# Patient Record
Sex: Male | Born: 1950 | Race: White | Hispanic: No | Marital: Married | Smoking: Former smoker
Health system: Southern US, Community
[De-identification: ages and names within clinical notes are randomized; demographics above are authoritative.]

## PROBLEM LIST (undated history)

## (undated) DIAGNOSIS — E785 Hyperlipidemia, unspecified: Secondary | ICD-10-CM

## (undated) DIAGNOSIS — G629 Polyneuropathy, unspecified: Secondary | ICD-10-CM

## (undated) DIAGNOSIS — Z8489 Family history of other specified conditions: Secondary | ICD-10-CM

## (undated) DIAGNOSIS — I2583 Coronary atherosclerosis due to lipid rich plaque: Secondary | ICD-10-CM

## (undated) DIAGNOSIS — N471 Phimosis: Secondary | ICD-10-CM

## (undated) DIAGNOSIS — N400 Enlarged prostate without lower urinary tract symptoms: Secondary | ICD-10-CM

## (undated) DIAGNOSIS — H269 Unspecified cataract: Secondary | ICD-10-CM

## (undated) DIAGNOSIS — H9192 Unspecified hearing loss, left ear: Secondary | ICD-10-CM

## (undated) DIAGNOSIS — G4733 Obstructive sleep apnea (adult) (pediatric): Secondary | ICD-10-CM

## (undated) DIAGNOSIS — H8109 Meniere's disease, unspecified ear: Secondary | ICD-10-CM

## (undated) DIAGNOSIS — H9319 Tinnitus, unspecified ear: Secondary | ICD-10-CM

## (undated) DIAGNOSIS — I2510 Atherosclerotic heart disease of native coronary artery without angina pectoris: Secondary | ICD-10-CM

## (undated) DIAGNOSIS — I10 Essential (primary) hypertension: Secondary | ICD-10-CM

## (undated) DIAGNOSIS — T7840XA Allergy, unspecified, initial encounter: Secondary | ICD-10-CM

## (undated) DIAGNOSIS — G5760 Lesion of plantar nerve, unspecified lower limb: Secondary | ICD-10-CM

## (undated) DIAGNOSIS — L989 Disorder of the skin and subcutaneous tissue, unspecified: Secondary | ICD-10-CM

## (undated) HISTORY — PX: CARDIAC CATHETERIZATION: SHX172

## (undated) HISTORY — DX: Allergy, unspecified, initial encounter: T78.40XA

## (undated) HISTORY — PX: MOHS SURGERY: SUR867

## (undated) HISTORY — DX: Tinnitus, unspecified ear: H93.19

## (undated) HISTORY — PX: UVULOPALATOPHARYNGOPLASTY: SHX827

## (undated) HISTORY — DX: Meniere's disease, unspecified ear: H81.09

## (undated) HISTORY — DX: Unspecified hearing loss, left ear: H91.92

## (undated) HISTORY — DX: Disorder of the skin and subcutaneous tissue, unspecified: L98.9

## (undated) HISTORY — DX: Hyperlipidemia, unspecified: E78.5

## (undated) HISTORY — DX: Unspecified cataract: H26.9

## (undated) HISTORY — DX: Essential (primary) hypertension: I10

## (undated) HISTORY — PX: CARDIOVASCULAR STRESS TEST: SHX262

## (undated) HISTORY — DX: Polyneuropathy, unspecified: G62.9

## (undated) HISTORY — PX: LASIK: SHX215

## (undated) HISTORY — PX: COLONOSCOPY: SHX174

## (undated) HISTORY — DX: Lesion of plantar nerve, unspecified lower limb: G57.60

---

## 1998-12-12 ENCOUNTER — Ambulatory Visit: Admission: RE | Admit: 1998-12-12 | Discharge: 1998-12-12 | Payer: Self-pay | Admitting: Otolaryngology

## 1999-01-27 ENCOUNTER — Encounter (INDEPENDENT_AMBULATORY_CARE_PROVIDER_SITE_OTHER): Payer: Self-pay | Admitting: Specialist

## 1999-01-27 ENCOUNTER — Other Ambulatory Visit: Admission: RE | Admit: 1999-01-27 | Discharge: 1999-01-27 | Payer: Self-pay | Admitting: Otolaryngology

## 1999-06-13 HISTORY — PX: FOOT SURGERY: SHX648

## 1999-12-21 ENCOUNTER — Other Ambulatory Visit: Admission: RE | Admit: 1999-12-21 | Discharge: 1999-12-21 | Payer: Self-pay | Admitting: Orthopedic Surgery

## 2000-03-13 ENCOUNTER — Encounter (INDEPENDENT_AMBULATORY_CARE_PROVIDER_SITE_OTHER): Payer: Self-pay | Admitting: Specialist

## 2000-03-13 ENCOUNTER — Other Ambulatory Visit: Admission: RE | Admit: 2000-03-13 | Discharge: 2000-03-13 | Payer: Self-pay | Admitting: Urology

## 2010-03-25 ENCOUNTER — Observation Stay (HOSPITAL_COMMUNITY): Admission: EM | Admit: 2010-03-25 | Discharge: 2010-03-26 | Payer: Self-pay | Admitting: Emergency Medicine

## 2010-03-25 ENCOUNTER — Encounter (INDEPENDENT_AMBULATORY_CARE_PROVIDER_SITE_OTHER): Payer: Self-pay | Admitting: Internal Medicine

## 2010-03-25 HISTORY — PX: TRANSTHORACIC ECHOCARDIOGRAM: SHX275

## 2010-08-24 LAB — BASIC METABOLIC PANEL
Calcium: 8.6 mg/dL (ref 8.4–10.5)
Creatinine, Ser: 0.84 mg/dL (ref 0.4–1.5)
GFR calc Af Amer: >60 mL/min (ref >60)
GFR calc non Af Amer: >60 mL/min (ref >60)
Sodium: 139 mEq/L (ref 135–145)

## 2010-08-25 LAB — RAPID URINE DRUG SCREEN, HOSP PERFORMED
Amphetamines: NOT DETECTED
Opiates: NOT DETECTED
Tetrahydrocannabinol: NOT DETECTED

## 2010-08-25 LAB — BASIC METABOLIC PANEL
CO2: 27 mEq/L (ref 19–32)
Calcium: 8.8 mg/dL (ref 8.4–10.5)
Calcium: 9 mg/dL (ref 8.4–10.5)
Creatinine, Ser: 0.93 mg/dL (ref 0.4–1.5)
GFR calc Af Amer: >60 mL/min (ref >60)
GFR calc Af Amer: >60 mL/min (ref >60)
GFR calc non Af Amer: >60 mL/min (ref >60)
GFR calc non Af Amer: >60 mL/min (ref >60)
Glucose, Bld: 110 mg/dL — ABNORMAL HIGH (ref 70–99)
Glucose, Bld: 119 mg/dL — ABNORMAL HIGH (ref 70–99)
Potassium: 2.8 mEq/L — ABNORMAL LOW (ref 3.5–5.1)
Sodium: 137 mEq/L (ref 135–145)
Sodium: 138 mEq/L (ref 135–145)

## 2010-08-25 LAB — CK TOTAL AND CKMB (NOT AT ARMC)
CK, MB: 1.1 ng/mL (ref 0.3–4.0)
CK, MB: 1.4 ng/mL (ref 0.3–4.0)
Relative Index: INVALID (ref 0.0–2.5)
Relative Index: INVALID (ref 0.0–2.5)
Total CK: 79 U/L (ref 7–232)
Total CK: 82 U/L (ref 7–232)
Total CK: 92 U/L (ref 7–232)

## 2010-08-25 LAB — POCT CARDIAC MARKERS
CKMB, poc: <1 ng/mL — ABNORMAL LOW (ref 1.0–8.0)
Myoglobin, poc: 38.2 ng/mL (ref 12–200)
Troponin i, poc: <0.05 ng/mL (ref 0.00–0.09)

## 2010-08-25 LAB — URINALYSIS, ROUTINE W REFLEX MICROSCOPIC
Bilirubin Urine: NEGATIVE
Nitrite: NEGATIVE
Protein, ur: NEGATIVE mg/dL
Specific Gravity, Urine: 1.006 (ref 1.005–1.030)
Urobilinogen, UA: 0.2 mg/dL (ref 0.0–1.0)

## 2010-08-25 LAB — DIFFERENTIAL
Lymphocytes Relative: 27 % (ref 12–46)
Lymphs Abs: 2.8 10*3/uL (ref 0.7–4.0)
Monocytes Absolute: 0.7 10*3/uL (ref 0.1–1.0)
Monocytes Relative: 7 % (ref 3–12)
Neutro Abs: 6.6 10*3/uL (ref 1.7–7.7)
Neutrophils Relative %: 64 % (ref 43–77)

## 2010-08-25 LAB — LIPID PANEL
Cholesterol: 125 mg/dL (ref 0–200)
HDL: 42 mg/dL (ref >39)
LDL Cholesterol: 65 mg/dL (ref 0–99)
Total CHOL/HDL Ratio: 3 RATIO

## 2010-08-25 LAB — CBC
HCT: 42.5 % (ref 39.0–52.0)
Hemoglobin: 15.1 g/dL (ref 13.0–17.0)
MCHC: 35.5 g/dL (ref 30.0–36.0)
RBC: 4.99 MIL/uL (ref 4.22–5.81)
WBC: 10.3 10*3/uL (ref 4.0–10.5)

## 2010-08-25 LAB — TSH: TSH: 0.639 u[IU]/mL (ref 0.350–4.500)

## 2010-08-25 LAB — HEMOGLOBIN A1C: Mean Plasma Glucose: 126 mg/dL — ABNORMAL HIGH (ref <117)

## 2010-09-13 ENCOUNTER — Other Ambulatory Visit (HOSPITAL_COMMUNITY): Payer: Self-pay | Admitting: Specialist

## 2010-09-13 ENCOUNTER — Other Ambulatory Visit: Payer: Self-pay | Admitting: Specialist

## 2010-09-13 ENCOUNTER — Other Ambulatory Visit (HOSPITAL_COMMUNITY): Payer: BC Managed Care – PPO

## 2010-09-13 ENCOUNTER — Ambulatory Visit (HOSPITAL_COMMUNITY)
Admission: RE | Admit: 2010-09-13 | Discharge: 2010-09-13 | Disposition: A | Discharge status: Home / Self Care | Payer: Medicare Other | Source: Ambulatory Visit | Attending: Specialist | Admitting: Specialist

## 2010-09-13 ENCOUNTER — Encounter (HOSPITAL_COMMUNITY): Payer: Medicare Other

## 2010-09-13 DIAGNOSIS — M5126 Other intervertebral disc displacement, lumbar region: Secondary | ICD-10-CM

## 2010-09-13 DIAGNOSIS — I10 Essential (primary) hypertension: Secondary | ICD-10-CM

## 2010-09-13 DIAGNOSIS — Z87891 Personal history of nicotine dependence: Secondary | ICD-10-CM

## 2010-09-13 DIAGNOSIS — Z01812 Encounter for preprocedural laboratory examination: Secondary | ICD-10-CM | POA: Insufficient documentation

## 2010-09-13 DIAGNOSIS — Z01810 Encounter for preprocedural cardiovascular examination: Secondary | ICD-10-CM | POA: Insufficient documentation

## 2010-09-13 DIAGNOSIS — Z01818 Encounter for other preprocedural examination: Secondary | ICD-10-CM | POA: Insufficient documentation

## 2010-09-13 LAB — URINALYSIS, ROUTINE W REFLEX MICROSCOPIC
Nitrite: NEGATIVE
Protein, ur: NEGATIVE mg/dL
Urobilinogen, UA: 0.2 mg/dL (ref 0.0–1.0)

## 2010-09-13 LAB — APTT: aPTT: 31 seconds (ref 24–37)

## 2010-09-13 LAB — COMPREHENSIVE METABOLIC PANEL
ALT: 36 U/L (ref 0–53)
AST: 27 U/L (ref 0–37)
Calcium: 9.6 mg/dL (ref 8.4–10.5)
GFR calc Af Amer: 60 mL/min — ABNORMAL LOW (ref >60)
Glucose, Bld: 121 mg/dL — ABNORMAL HIGH (ref 70–99)
Sodium: 137 mEq/L (ref 135–145)
Total Protein: 6.4 g/dL (ref 6.0–8.3)

## 2010-09-13 LAB — CBC
MCH: 28.7 pg (ref 26.0–34.0)
MCHC: 32.9 g/dL (ref 30.0–36.0)
Platelets: 218 10*3/uL (ref 150–400)
RBC: 5.15 MIL/uL (ref 4.22–5.81)

## 2010-09-13 LAB — PROTIME-INR: Prothrombin Time: 12.8 seconds (ref 11.6–15.2)

## 2010-09-13 LAB — SURGICAL PCR SCREEN: Staphylococcus aureus: NEGATIVE

## 2010-09-14 ENCOUNTER — Ambulatory Visit (HOSPITAL_COMMUNITY)
Admission: RE | Admit: 2010-09-14 | Discharge: 2010-09-14 | Disposition: A | Discharge status: Home / Self Care | Payer: Medicare Other | Source: Ambulatory Visit | Attending: Specialist | Admitting: Specialist

## 2010-09-14 ENCOUNTER — Other Ambulatory Visit: Payer: Self-pay | Admitting: Specialist

## 2010-09-14 ENCOUNTER — Observation Stay (HOSPITAL_COMMUNITY)
Admission: RE | Admit: 2010-09-14 | Discharge: 2010-09-15 | Disposition: A | Discharge status: Home / Self Care | Payer: Medicare Other | Source: Ambulatory Visit | Attending: Specialist | Admitting: Specialist

## 2010-09-14 ENCOUNTER — Other Ambulatory Visit (HOSPITAL_COMMUNITY): Payer: Self-pay | Admitting: Specialist

## 2010-09-14 DIAGNOSIS — M5126 Other intervertebral disc displacement, lumbar region: Principal | ICD-10-CM | POA: Insufficient documentation

## 2010-09-14 DIAGNOSIS — Z9889 Other specified postprocedural states: Secondary | ICD-10-CM

## 2010-09-14 DIAGNOSIS — Z01818 Encounter for other preprocedural examination: Secondary | ICD-10-CM | POA: Insufficient documentation

## 2010-09-14 DIAGNOSIS — G4733 Obstructive sleep apnea (adult) (pediatric): Secondary | ICD-10-CM | POA: Insufficient documentation

## 2010-09-14 DIAGNOSIS — E669 Obesity, unspecified: Secondary | ICD-10-CM | POA: Insufficient documentation

## 2010-09-14 DIAGNOSIS — Z01811 Encounter for preprocedural respiratory examination: Secondary | ICD-10-CM | POA: Insufficient documentation

## 2010-09-14 DIAGNOSIS — I10 Essential (primary) hypertension: Secondary | ICD-10-CM | POA: Insufficient documentation

## 2010-09-14 DIAGNOSIS — Z01812 Encounter for preprocedural laboratory examination: Secondary | ICD-10-CM | POA: Insufficient documentation

## 2010-09-14 HISTORY — PX: LUMBAR MICRODISCECTOMY: SHX99

## 2010-09-15 NOTE — Op Note (Signed)
NAME:  Charles Martinez, Charles Martinez                 ACCOUNT NO.:  [id number]  MEDICAL RECORD NO.:  [id number]           PATIENT TYPE:  O  LOCATION:  1611                         FACILITY:  [facility]  PHYSICIAN:  Jene Every, M.D.    DATE OF BIRTH:  1950-12-29  DATE OF PROCEDURE: DATE OF DISCHARGE:                              OPERATIVE REPORT   PREOPERATIVE DIAGNOSES:  Spinal stenosis, herniated nucleus pulposus L5- S1, right.  POSTOPERATIVE DIAGNOSES:  Spinal stenosis, herniated nucleus pulposus L5- S1, right.  PROCEDURES PERFORMED: 1. Lumbar decompression L5-S1, right. 2. Foraminotomies of L5 and S1. 3. Microdiskectomy, L5-S1.  ANESTHESIA:  General.  ASSISTANT:  Roma Schanz, P.A.  SPECIMEN:  L5-S1 disk to pathology.  HISTORY:  This is a 60 year old patient with stenosis, HNP affecting the L5-S1 nerve roots, confirmed by MRI by herniation and stenosis, indicated for decompression.  Risks and benefits discussed, including bleeding, infection, injury to the structures, no change in symptoms, worsening symptoms, need for repeat debridement, DVT, PE, anesthetic complications, etc.  TECHNIQUE:  The patient was placed in supine position.  After induction of adequate general anesthesia, 2 g of Kefzol was placed prone on Andrews frame.  All bony prominences were well padded.  Lumbar region prepped and draped in the usual sterile fashion.  Two 18-gauge spinal needles were utilized to localize L5-S1 interspace confirmed with x-ray. Incision was made from spinous process of L5-S1.  Subcutaneous tissues were dissected, electrocautery was utilized to achieve hemostasis. Dorsolumbar fascia identified and divided in line with the skin incision.  Paraspinous muscle elevated from lamina L5-L1.  Operating microscope draped and brought in the surgical field.  Confirmatory radiograph obtained with a Penfield 4 at the disk space of L5-L1. Hemilaminotomy of the caudad edge of 5 was performed with 2-mm  Kerrison. Straight curette to detached ligamentum flavum from cephalad edge of S1. Ligamentum flavum removed from the interspace.  It was hypertrophic. Facet hypertrophy was noted to decompress the lateral recess to the medial border of the pedicle, the partial medial facetectomy, foraminotomy of S1 was performed.  S1 nerve root was identified and gently mobilized medially.  It was noted to be compressing the lateral recess.  Foraminotomy of L5 was performed as well,  There was stenosis noted here.  Large HNP central to the right was noted.  I performed an annulotomy.  Copious portion of disk material was removed from the disk with straight upbiting pituitary further mobilizing the nerve hook hockey stick.  This was expressed with multiple fragments sent to pathology.  Disk space was copiously irrigated with antibiotic irrigation.  Confirmatory radiographs obtained.  Hockey stick probe passed freely up the foramen of L5 and S1.  Inspection revealed no CSF leakage or active bleeding, good restoration of thecal sac nerve root. Thrombin-soaked Gelfoam was placed in the laminotomy defect. Paraspinous muscles was inspected with no evidence of active bleeding. Dorsum and fascia reapproximated with #1 Vicryl figure-of-8 sutures. Skin was reapproximated with 4 subcuticular Prolene for Steri-Strips. Sterile surgical dressing applied.  Placed supine on hospital bed, extubated without difficulty and transported to the recovery room in satisfactory condition.  The  patient tolerated the procedure well.  There were no complications.  ESTIMATED BLOOD LOSS:  Minimal.     Jene Every, M.D.     JB/MEDQ  D:  09/14/2010  T:  09/15/2010  Job:  [id number]  Electronically Signed by Jene Every M.D. on 09/15/2010 12:54:44 PM

## 2010-10-05 ENCOUNTER — Ambulatory Visit: Payer: Medicare Other | Attending: Specialist | Admitting: Physical Therapy

## 2010-10-05 DIAGNOSIS — M545 Low back pain, unspecified: Secondary | ICD-10-CM | POA: Insufficient documentation

## 2010-10-05 DIAGNOSIS — IMO0001 Reserved for inherently not codable concepts without codable children: Secondary | ICD-10-CM | POA: Insufficient documentation

## 2010-10-05 DIAGNOSIS — R5381 Other malaise: Secondary | ICD-10-CM | POA: Insufficient documentation

## 2010-10-07 ENCOUNTER — Ambulatory Visit: Payer: Medicare Other | Admitting: Physical Therapy

## 2010-10-11 ENCOUNTER — Ambulatory Visit: Payer: Medicare Other | Attending: Specialist | Admitting: Physical Therapy

## 2010-10-11 DIAGNOSIS — IMO0001 Reserved for inherently not codable concepts without codable children: Secondary | ICD-10-CM | POA: Insufficient documentation

## 2010-10-11 DIAGNOSIS — R5381 Other malaise: Secondary | ICD-10-CM | POA: Insufficient documentation

## 2010-10-11 DIAGNOSIS — M545 Low back pain, unspecified: Secondary | ICD-10-CM | POA: Insufficient documentation

## 2010-10-13 ENCOUNTER — Ambulatory Visit: Payer: Medicare Other | Admitting: Physical Therapy

## 2010-10-18 ENCOUNTER — Ambulatory Visit: Payer: Medicare Other | Admitting: Physical Therapy

## 2010-10-20 ENCOUNTER — Ambulatory Visit: Payer: Medicare Other | Admitting: Physical Therapy

## 2010-10-25 ENCOUNTER — Ambulatory Visit: Payer: Medicare Other | Admitting: *Deleted

## 2010-10-28 ENCOUNTER — Ambulatory Visit: Payer: Medicare Other | Admitting: *Deleted

## 2010-11-01 ENCOUNTER — Ambulatory Visit: Payer: Medicare Other | Admitting: *Deleted

## 2010-11-04 ENCOUNTER — Ambulatory Visit: Payer: Medicare Other | Admitting: Physical Therapy

## 2010-11-09 ENCOUNTER — Ambulatory Visit: Payer: Medicare Other | Admitting: Physical Therapy

## 2010-11-16 ENCOUNTER — Ambulatory Visit: Payer: Medicare Other | Attending: Specialist | Admitting: Physical Therapy

## 2010-11-16 DIAGNOSIS — M545 Low back pain, unspecified: Secondary | ICD-10-CM | POA: Insufficient documentation

## 2010-11-16 DIAGNOSIS — IMO0001 Reserved for inherently not codable concepts without codable children: Secondary | ICD-10-CM | POA: Insufficient documentation

## 2010-11-16 DIAGNOSIS — R5381 Other malaise: Secondary | ICD-10-CM | POA: Insufficient documentation

## 2011-02-09 ENCOUNTER — Other Ambulatory Visit: Payer: Self-pay | Admitting: Ophthalmology

## 2011-10-09 ENCOUNTER — Other Ambulatory Visit: Payer: Self-pay | Admitting: Dermatology

## 2011-10-24 IMAGING — CR DG SPINE 1V PORT
1 series · 1 of 1 positions shown · non-contrast
Comparison: Multiple priors.

CLINICAL DATA: Back pain

LUMBAR SPINE 1 VIEW

[lateral]
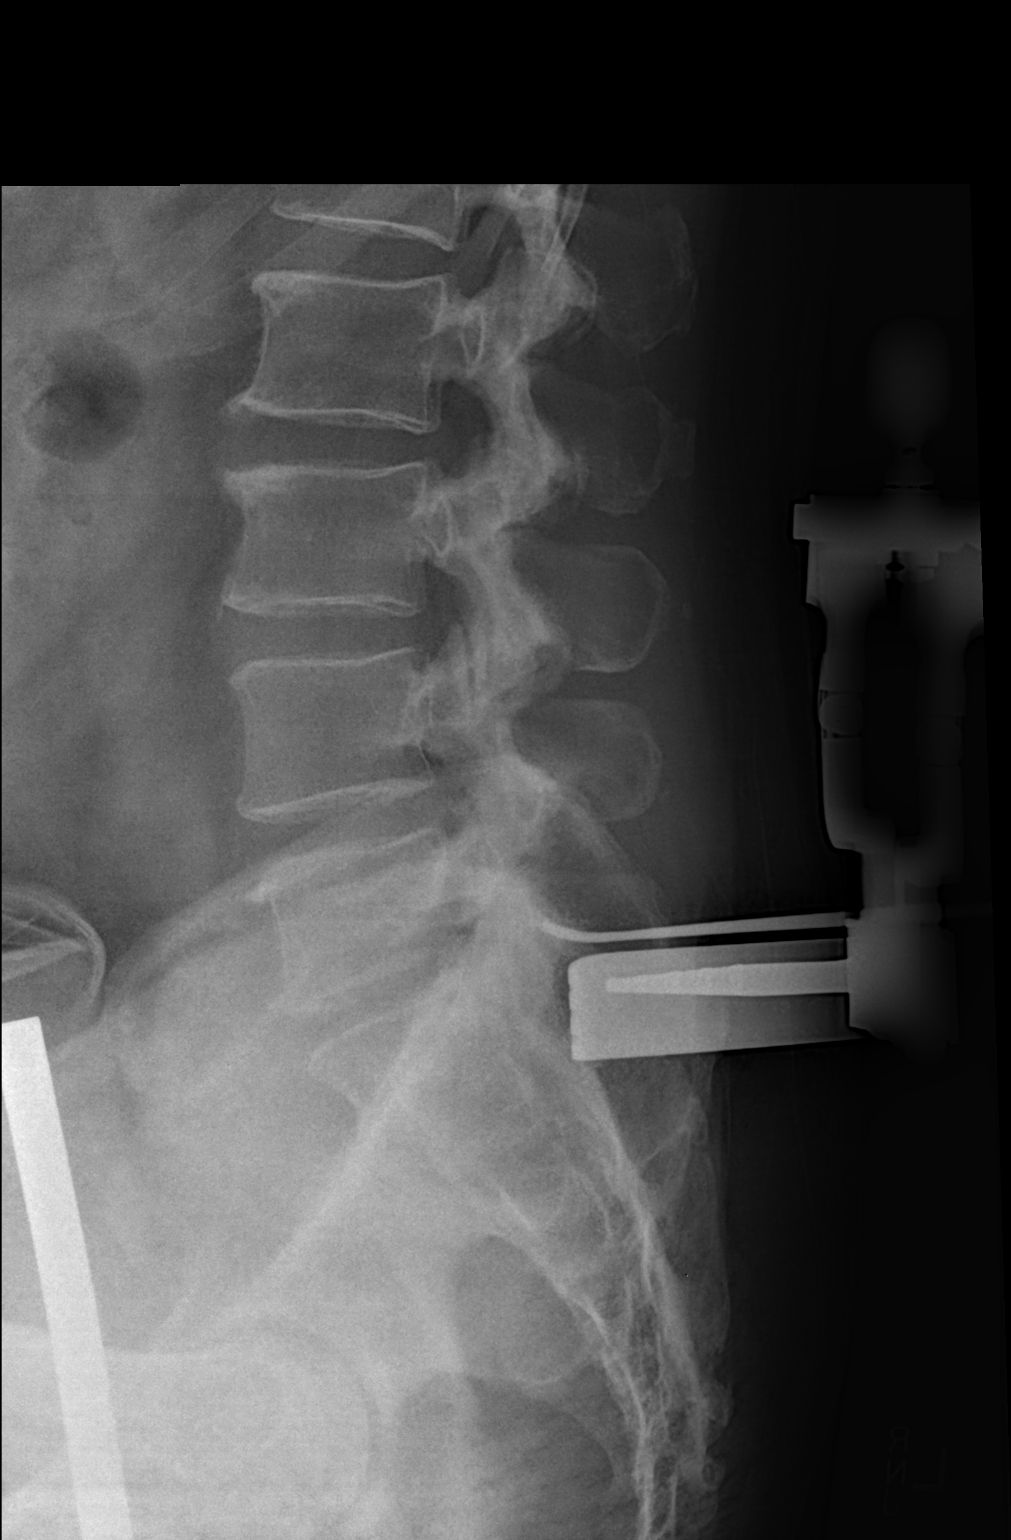

[1 of 1 positions shown; findings below may reference images not displayed]

FINDINGS: Blunt probe from a posterior approach directed most
closely toward L5-S1.
IMPRESSION: As above.

## 2013-04-02 ENCOUNTER — Other Ambulatory Visit: Payer: Self-pay

## 2013-04-02 ENCOUNTER — Other Ambulatory Visit: Payer: Self-pay | Admitting: Family Medicine

## 2013-04-02 DIAGNOSIS — N62 Hypertrophy of breast: Secondary | ICD-10-CM

## 2013-04-15 ENCOUNTER — Ambulatory Visit
Admission: RE | Admit: 2013-04-15 | Discharge: 2013-04-15 | Disposition: A | Discharge status: Home / Self Care | Payer: Medicare Other | Source: Ambulatory Visit | Attending: Family Medicine | Admitting: Family Medicine

## 2013-04-15 DIAGNOSIS — N62 Hypertrophy of breast: Secondary | ICD-10-CM

## 2013-04-24 ENCOUNTER — Encounter: Payer: Self-pay | Admitting: Internal Medicine

## 2013-04-24 ENCOUNTER — Ambulatory Visit (INDEPENDENT_AMBULATORY_CARE_PROVIDER_SITE_OTHER): Payer: Medicare Other | Admitting: Internal Medicine

## 2013-04-24 VITALS — BP 128/68 | HR 78 | Ht 70.0 in | Wt 210.5 lb

## 2013-04-24 DIAGNOSIS — I2510 Atherosclerotic heart disease of native coronary artery without angina pectoris: Secondary | ICD-10-CM

## 2013-04-24 DIAGNOSIS — E785 Hyperlipidemia, unspecified: Secondary | ICD-10-CM

## 2013-04-24 DIAGNOSIS — R600 Localized edema: Secondary | ICD-10-CM

## 2013-04-24 DIAGNOSIS — R609 Edema, unspecified: Secondary | ICD-10-CM

## 2013-04-24 DIAGNOSIS — I10 Essential (primary) hypertension: Secondary | ICD-10-CM

## 2013-04-24 NOTE — Patient Instructions (Signed)
Your physician wants you to follow-up in: 1 year. You will receive a reminder letter in the mail two months in advance. If you don't receive a letter, please call our office to schedule the follow-up appointment.  

## 2013-04-25 ENCOUNTER — Encounter: Payer: Self-pay | Admitting: Internal Medicine

## 2013-04-25 DIAGNOSIS — R600 Localized edema: Secondary | ICD-10-CM | POA: Insufficient documentation

## 2013-04-25 DIAGNOSIS — E785 Hyperlipidemia, unspecified: Secondary | ICD-10-CM | POA: Insufficient documentation

## 2013-04-25 DIAGNOSIS — I10 Essential (primary) hypertension: Secondary | ICD-10-CM | POA: Insufficient documentation

## 2013-04-25 DIAGNOSIS — I2510 Atherosclerotic heart disease of native coronary artery without angina pectoris: Secondary | ICD-10-CM | POA: Insufficient documentation

## 2013-04-25 NOTE — Progress Notes (Signed)
OFFICE NOTE  Chief Complaint:  Routine follow-up  Primary Care Physician: Warrick Parisian, MD  HPI:  Charles Martinez is a 62 year old gentleman with a history of dyslipidemia, hypokalemia, hypertension, and possibly either occluded distal LAD or congenitally occluded LAD distally. He has had problems with sciatic pain and recently had back surgery with marked improvement. He has also had dyslipidemia but has been intolerant to statins and is only fenofibrate at this time. After that episode in 2011 with hypertension and hypokalemia he was on a number of blood pressure medicines. However, recently he has been feeling fatigued and dizzy. He checked his blood pressure and it was in the 80s systolic and he has since then cut back on his medicines. He has discontinued labetalol altogether and previously was on 200 b.i.d. He recently stopped his hydralazine due to low blood pressure and is only on ramipril 10 mg and aspirin 81 mg daily. He also takes Aldactone 50 mg daily. He has no complaints as far as chest pain, worsening shortness of breath, palpitations, presyncope, or syncopal symptoms.    PMHx:  Past Medical History  Diagnosis Date  . Dyslipidemia     intolerant to statins  . Hypertension   . Hypokalemia     History reviewed. No pertinent past surgical history.  FAMHx:  No family history on file.  SOCHx:   reports that he quit smoking about 18 years ago. He has never used smokeless tobacco. He reports that he does not drink alcohol or use illicit drugs.  ALLERGIES:  Allergies  Allergen Reactions  . Bystolic [Nebivolol Hcl]     SOB  . Hctz [Hydrochlorothiazide]   . Hydrazine Yellow [Tartrazine]     SOB   . Lipitor [Atorvastatin]     myalgias    ROS: A comprehensive review of systems was negative.  HOME MEDS: Current Outpatient Prescriptions  Medication Sig Dispense Refill  . ACETYLCARN-ALPHA LIPOIC ACID PO Take by mouth daily.      . Ascorbic Acid (VITAMIN C PO)  Take by mouth daily.      Marland Kitchen aspirin 81 MG tablet Take 81 mg by mouth daily.      Marland Kitchen b complex vitamins capsule Take 1 capsule by mouth daily.      . Biotin 5000 MCG TABS Take 1 tablet by mouth daily.      . Calcium Citrate-Vitamin D (CALCIUM CITRATE + D3 PO) Take by mouth daily.      . Cholecalciferol (VITAMIN D-3) 1000 UNITS CAPS Take 2,000 Units by mouth daily.      . clonazePAM (KLONOPIN) 0.5 MG tablet Take 0.5 mg by mouth 2 (two) times daily as needed for anxiety.      . Coenzyme Q10 (CO Q-10) 100 MG CAPS Take 1 capsule by mouth daily.      . finasteride (PROSCAR) 5 MG tablet Take 5 mg by mouth daily.      Marland Kitchen gabapentin (NEURONTIN) 300 MG capsule Take 300 mg by mouth as needed.      . magnesium oxide (MAG-OX) 400 MG tablet Take 400 mg by mouth daily.      Marland Kitchen NIACIN PO Take by mouth daily.      . potassium chloride SA (K-DUR,KLOR-CON) 20 MEQ tablet Take 20 mEq by mouth daily.      . ramipril (ALTACE) 10 MG capsule Take 10 mg by mouth daily.      . Red Yeast Rice 600 MG CAPS Take 1,200 mg by mouth 2 (two) times  daily.      . spironolactone (ALDACTONE) 50 MG tablet Take 50 mg by mouth daily.      . vitamin B-12 (CYANOCOBALAMIN) 1000 MCG tablet Take 1,000 mcg by mouth daily.       No current facility-administered medications for this visit.    LABS/IMAGING: No results found for this or any previous visit (from the past 48 hour(s)). No results found.  VITALS: BP 128/68  Pulse 78  Ht 5\' 10"  (1.778 m)  Wt 210 lb 8 oz (95.482 kg)  BMI 30.20 kg/m2  EXAM: General appearance: alert and no distress Neck: no carotid bruit and no JVD Lungs: clear to auscultation bilaterally Heart: regular rate and rhythm, S1, S2 normal, no murmur, click, rub or gallop Abdomen: soft, non-tender; bowel sounds normal; no masses,  no organomegaly Extremities: extremities normal, atraumatic, no cyanosis or edema Pulses: 2+ and symmetric Skin: Skin color, texture, turgor normal. No rashes or lesions Neurologic:  Grossly normal Psych: Mood, affect normal  EKG: Rhythm with PACs, nonspecific ST changes at 78  ASSESSMENT: 1. Hypertension-well controlled 2. Lower extremity edema 3. Mild anxiety 4. Dyslipidemia-intolerant to statins 5. 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   For some reason [name] blood pressure is getting lower. This may be due to retirement and less stress. Some of it may be situational. He is come off of hydralazine completely. Currently his blood pressures are well controlled. I've encouraged him to keep working on exercise and weight loss to manage his dyslipidemia as he has been intolerant to almost all statins. To see him back annually.  Chrystie Nose, MD, [facility] Attending Cardiologist [facility]  Vietta Bonifield C 04/25/2013, 11:54 AM'

## 2014-04-24 ENCOUNTER — Encounter: Payer: Self-pay | Admitting: Internal Medicine

## 2014-04-24 ENCOUNTER — Ambulatory Visit (INDEPENDENT_AMBULATORY_CARE_PROVIDER_SITE_OTHER): Payer: Medicare Other | Admitting: Internal Medicine

## 2014-04-24 VITALS — BP 120/80 | HR 74 | Ht 70.0 in | Wt 207.2 lb

## 2014-04-24 DIAGNOSIS — I2583 Coronary atherosclerosis due to lipid rich plaque: Secondary | ICD-10-CM

## 2014-04-24 DIAGNOSIS — E785 Hyperlipidemia, unspecified: Secondary | ICD-10-CM

## 2014-04-24 DIAGNOSIS — I2510 Atherosclerotic heart disease of native coronary artery without angina pectoris: Secondary | ICD-10-CM

## 2014-04-24 DIAGNOSIS — I10 Essential (primary) hypertension: Secondary | ICD-10-CM

## 2014-04-24 NOTE — Progress Notes (Signed)
OFFICE NOTE  Chief Complaint:  Routine follow-up  [facility]  HPI:  Kathee DeltonRoger L Martinez is a 63 year old gentleman with a history of dyslipidemia, hypokalemia, hypertension, and possibly either occluded distal LAD or congenitally occluded LAD distally. He has had problems with sciatic pain and recently had back surgery with marked improvement. He has also had dyslipidemia but has been intolerant to statins and is only fenofibrate at this time. After that episode in 2011 with hypertension and hypokalemia he was on a number of blood pressure medicines. However, recently he has been feeling fatigued and dizzy. He checked his blood pressure and it was in the 80s systolic and he has since then cut back on his medicines. He has discontinued labetalol altogether and previously was on 200 b.i.d. He recently stopped his hydralazine due to low blood pressure and is only on ramipril 10 mg and aspirin 81 mg daily. He also takes Aldactone 50 mg daily. He has no complaints as far as chest pain, worsening shortness of breath, palpitations, presyncope, or syncopal symptoms.   [name] returns today for follow-up. He denies any chest pain or worsening shortness of breath. He has had a couple episodes where he felt that he was not in often quickly woke up. This does not happen while driving but he did have an episode about 2 years ago. We have been decreasing his blood pressure medications although seems to be stable now only on ramipril and Aldactone.  PMHx:  Past Medical History  Diagnosis Date  . Dyslipidemia     intolerant to statins  . Hypertension   . Hypokalemia   . Skin disorder   . High cholesterol   . Sleep apnea, obstructive   . Meniere's disease   . Tinnitus   . Hearing loss in left ear   . Morton's neuroma     Past Surgical History  Procedure Laterality Date  . Back surgery    . Foot surgery  2001  . Nose and throat surgery  2000    . Doppler echocardiography    . Lexiscan myoview      FAMHx:  History reviewed. No pertinent family history.  SOCHx:   reports that he quit smoking about 19 years ago. He has never used smokeless tobacco. He reports that he does not drink alcohol or use illicit drugs.  ALLERGIES:  Allergies  Allergen Reactions  . Bystolic [Nebivolol Hcl]     SOB  . Hctz [Hydrochlorothiazide]   . Hydrazine Yellow [Tartrazine]     SOB   . Lipitor [Atorvastatin]     myalgias    ROS: A comprehensive review of systems was negative.  HOME MEDS: Current Outpatient Prescriptions  Medication Sig Dispense Refill  . ACETYLCARN-ALPHA LIPOIC ACID PO Take by mouth daily.    . Ascorbic Acid (VITAMIN C PO) Take by mouth daily.    Marland Kitchen. aspirin 81 MG tablet Take 81 mg by mouth daily.    Marland Kitchen. b complex vitamins capsule Take 1 capsule by mouth daily.    . Biotin 5000 MCG TABS Take 1 tablet by mouth daily.    . Calcium Citrate-Vitamin D (CALCIUM CITRATE + D3 PO) Take by mouth daily.    . Cholecalciferol (VITAMIN D-3) 1000 UNITS CAPS Take 2,000 Units by mouth daily.    . clonazePAM (KLONOPIN) 0.5 MG tablet Take 0.5 mg by mouth 2 (two) times daily as needed for anxiety.    . Coenzyme Q10 (CO Q-10)  100 MG CAPS Take 1 capsule by mouth daily.    . finasteride (PROSCAR) 5 MG tablet Take 5 mg by mouth daily.    Marland Kitchen. gabapentin (NEURONTIN) 300 MG capsule Take 300 mg by mouth as needed.    . magnesium oxide (MAG-OX) 400 MG tablet Take 400 mg by mouth daily.    Marland Kitchen. NIACIN PO Take by mouth daily.    . potassium chloride SA (K-DUR,KLOR-CON) 20 MEQ tablet Take 20 mEq by mouth daily.    . ramipril (ALTACE) 10 MG capsule Take 10 mg by mouth daily.    . Red Yeast Rice 600 MG CAPS Take 1,200 mg by mouth 2 (two) times daily.    Marland Kitchen. spironolactone (ALDACTONE) 50 MG tablet Take 50 mg by mouth daily.    . vitamin B-12 (CYANOCOBALAMIN) 1000 MCG tablet Take 1,000 mcg by mouth daily.     No current facility-administered medications for this  visit.    LABS/IMAGING: No results found for this or any previous visit (from the past 48 hour(s)). No results found.  VITALS: BP 120/80 mmHg  Pulse 74  Ht 5\' 10"  (1.778 m)  Wt 207 lb 3.2 oz (93.985 kg)  BMI 29.73 kg/m2  EXAM: General appearance: alert and no distress Neck: no carotid bruit and no JVD Lungs: clear to auscultation bilaterally Heart: regular rate and rhythm, S1, S2 normal, no murmur, click, rub or gallop Abdomen: soft, non-tender; bowel sounds normal; no masses,  no organomegaly Extremities: extremities normal, atraumatic, no cyanosis or edema Pulses: 2+ and symmetric Skin: Skin color, texture, turgor normal. No rashes or lesions Neurologic: Grossly normal Psych: Mood, affect normal  EKG: Normal sinus rhythm at 74  ASSESSMENT: 1. Hypertension-well controlled 2. Lower extremity edema 3. Mild anxiety 4. Dyslipidemia-intolerant to statins 5. 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   [name] blood pressure appears to be stable on his current medicines. He occasionally gets some leg edema however is not significant today. He has been intolerant to statins in the past and his cholesterol is followed by his primary care provider. 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 Is not any active coronary disease since then I'm aware of. He also has a history of sleep apnea but underwent UVPP surgeryand reports he sleeps well, does not snore and has good energy levels. I would recommend continue his current medications and plan to see him back annually or sooner as necessary.  Chrystie NoseKenneth C. Bobbie Valletta, MD, [facility] Attending Cardiologist [facility]  Rozalyn Osland C 04/24/2014, 10:49 AM'

## 2014-04-24 NOTE — Patient Instructions (Signed)
Your physician wants you to follow-up in: 1 year with Dr. Hilty. You will receive a reminder letter in the mail two months in advance. If you don't receive a letter, please call our office to schedule the follow-up appointment.  

## 2015-02-02 ENCOUNTER — Other Ambulatory Visit: Payer: Self-pay | Admitting: Urology

## 2015-03-16 ENCOUNTER — Encounter (HOSPITAL_BASED_OUTPATIENT_CLINIC_OR_DEPARTMENT_OTHER): Payer: Self-pay | Admitting: *Deleted

## 2015-03-18 ENCOUNTER — Encounter (HOSPITAL_BASED_OUTPATIENT_CLINIC_OR_DEPARTMENT_OTHER): Payer: Self-pay | Admitting: *Deleted

## 2015-03-18 NOTE — Progress Notes (Signed)
Pt instructed npo pmn 10/09 x klonopin and potassium w sip of water.  To [facility] 10/10 @ 0830.  Needs istat on arrival.  ekg w chart

## 2015-03-22 ENCOUNTER — Ambulatory Visit (HOSPITAL_BASED_OUTPATIENT_CLINIC_OR_DEPARTMENT_OTHER): Payer: Medicare Other | Admitting: Anesthesiology

## 2015-03-22 ENCOUNTER — Ambulatory Visit (HOSPITAL_BASED_OUTPATIENT_CLINIC_OR_DEPARTMENT_OTHER)
Admission: RE | Admit: 2015-03-22 | Discharge: 2015-03-22 | Disposition: A | Discharge status: Home / Self Care | Payer: Medicare Other | Source: Ambulatory Visit | Attending: Urology | Admitting: Urology

## 2015-03-22 ENCOUNTER — Encounter (HOSPITAL_BASED_OUTPATIENT_CLINIC_OR_DEPARTMENT_OTHER): Payer: Self-pay | Admitting: *Deleted

## 2015-03-22 ENCOUNTER — Encounter (HOSPITAL_BASED_OUTPATIENT_CLINIC_OR_DEPARTMENT_OTHER)
Admission: RE | Disposition: A | Discharge status: Home / Self Care | Payer: Self-pay | Source: Ambulatory Visit | Attending: Urology

## 2015-03-22 DIAGNOSIS — Z7982 Long term (current) use of aspirin: Secondary | ICD-10-CM | POA: Insufficient documentation

## 2015-03-22 DIAGNOSIS — G4733 Obstructive sleep apnea (adult) (pediatric): Secondary | ICD-10-CM | POA: Insufficient documentation

## 2015-03-22 DIAGNOSIS — N486 Induration penis plastica: Secondary | ICD-10-CM | POA: Diagnosis not present

## 2015-03-22 DIAGNOSIS — E785 Hyperlipidemia, unspecified: Secondary | ICD-10-CM | POA: Insufficient documentation

## 2015-03-22 DIAGNOSIS — N529 Male erectile dysfunction, unspecified: Secondary | ICD-10-CM | POA: Insufficient documentation

## 2015-03-22 DIAGNOSIS — Z79899 Other long term (current) drug therapy: Secondary | ICD-10-CM | POA: Insufficient documentation

## 2015-03-22 DIAGNOSIS — I10 Essential (primary) hypertension: Secondary | ICD-10-CM | POA: Diagnosis not present

## 2015-03-22 DIAGNOSIS — N471 Phimosis: Secondary | ICD-10-CM

## 2015-03-22 DIAGNOSIS — I2583 Coronary atherosclerosis due to lipid rich plaque: Secondary | ICD-10-CM | POA: Insufficient documentation

## 2015-03-22 DIAGNOSIS — I2510 Atherosclerotic heart disease of native coronary artery without angina pectoris: Secondary | ICD-10-CM | POA: Diagnosis not present

## 2015-03-22 DIAGNOSIS — N480 Leukoplakia of penis: Secondary | ICD-10-CM | POA: Insufficient documentation

## 2015-03-22 DIAGNOSIS — G4730 Sleep apnea, unspecified: Secondary | ICD-10-CM | POA: Diagnosis not present

## 2015-03-22 DIAGNOSIS — Z87891 Personal history of nicotine dependence: Secondary | ICD-10-CM | POA: Insufficient documentation

## 2015-03-22 DIAGNOSIS — N400 Enlarged prostate without lower urinary tract symptoms: Secondary | ICD-10-CM | POA: Insufficient documentation

## 2015-03-22 DIAGNOSIS — H8109 Meniere's disease, unspecified ear: Secondary | ICD-10-CM | POA: Diagnosis not present

## 2015-03-22 HISTORY — DX: Benign prostatic hyperplasia without lower urinary tract symptoms: N40.0

## 2015-03-22 HISTORY — DX: Coronary atherosclerosis due to lipid rich plaque: I25.83

## 2015-03-22 HISTORY — DX: Obstructive sleep apnea (adult) (pediatric): G47.33

## 2015-03-22 HISTORY — DX: Phimosis: N47.1

## 2015-03-22 HISTORY — DX: Atherosclerotic heart disease of native coronary artery without angina pectoris: I25.10

## 2015-03-22 HISTORY — PX: CIRCUMCISION: SHX1350

## 2015-03-22 HISTORY — DX: Family history of other specified conditions: Z84.89

## 2015-03-22 LAB — POCT I-STAT 4, (NA,K, GLUC, HGB,HCT)
GLUCOSE: 102 mg/dL — AB (ref 65–99)
HCT: 49 % (ref 39.0–52.0)
HEMOGLOBIN: 16.7 g/dL (ref 13.0–17.0)
POTASSIUM: 4.2 mmol/L (ref 3.5–5.1)
SODIUM: 138 mmol/L (ref 135–145)

## 2015-03-22 SURGERY — CIRCUMCISION, ADULT
Anesthesia: Monitor Anesthesia Care

## 2015-03-22 MED ORDER — MIDAZOLAM HCL 5 MG/5ML IJ SOLN
INTRAMUSCULAR | Status: DC | PRN
  Administered 2015-03-22: 2 mg via INTRAVENOUS

## 2015-03-22 MED ORDER — CEFAZOLIN SODIUM-DEXTROSE 2-3 GM-% IV SOLR
INTRAVENOUS | Status: AC
  Filled 2015-03-22: qty 50

## 2015-03-22 MED ORDER — FENTANYL CITRATE (PF) 100 MCG/2ML IJ SOLN
25.0000 ug | INTRAMUSCULAR | Status: DC | PRN
  Filled 2015-03-22: qty 1

## 2015-03-22 MED ORDER — ONDANSETRON HCL 4 MG/2ML IJ SOLN
INTRAMUSCULAR | Status: DC | PRN
  Administered 2015-03-22: 4 mg via INTRAVENOUS

## 2015-03-22 MED ORDER — SODIUM CHLORIDE 0.9 % IR SOLN
Status: DC | PRN
  Administered 2015-03-22: 500 mL

## 2015-03-22 MED ORDER — LACTATED RINGERS IV SOLN
INTRAVENOUS | Status: DC
  Administered 2015-03-22: 10:00:00 via INTRAVENOUS
  Filled 2015-03-22: qty 1000

## 2015-03-22 MED ORDER — PROPOFOL 500 MG/50ML IV EMUL
INTRAVENOUS | Status: DC | PRN
  Administered 2015-03-22: 200 ug/kg/min via INTRAVENOUS

## 2015-03-22 MED ORDER — CEFAZOLIN SODIUM 1-5 GM-% IV SOLN
1.0000 g | INTRAVENOUS | Status: DC
  Filled 2015-03-22: qty 50

## 2015-03-22 MED ORDER — ONDANSETRON HCL 4 MG/2ML IJ SOLN
4.0000 mg | Freq: Once | INTRAMUSCULAR | Status: DC | PRN
  Filled 2015-03-22: qty 2

## 2015-03-22 MED ORDER — MIDAZOLAM HCL 2 MG/2ML IJ SOLN
INTRAMUSCULAR | Status: AC
  Filled 2015-03-22: qty 2

## 2015-03-22 MED ORDER — CEFAZOLIN SODIUM-DEXTROSE 2-3 GM-% IV SOLR
2.0000 g | INTRAVENOUS | Status: AC
  Administered 2015-03-22: 2 g via INTRAVENOUS
  Filled 2015-03-22: qty 50

## 2015-03-22 MED ORDER — BUPIVACAINE HCL 0.25 % IJ SOLN
INTRAMUSCULAR | Status: DC | PRN
  Administered 2015-03-22: 23 mL

## 2015-03-22 MED ORDER — LIDOCAINE HCL (CARDIAC) 20 MG/ML IV SOLN
INTRAVENOUS | Status: DC | PRN
  Administered 2015-03-22: 50 mg via INTRAVENOUS

## 2015-03-22 MED ORDER — HYDROCODONE-ACETAMINOPHEN 5-325 MG PO TABS: 1.0000 | ORAL_TABLET | ORAL | Status: DC | PRN

## 2015-03-22 MED ORDER — FENTANYL CITRATE (PF) 100 MCG/2ML IJ SOLN
INTRAMUSCULAR | Status: AC
  Filled 2015-03-22: qty 2

## 2015-03-22 SURGICAL SUPPLY — 25 items
BANDAGE CO FLEX L/F 1IN X 5YD (GAUZE/BANDAGES/DRESSINGS) ×3 IMPLANT
BLADE SURG 15 STRL LF DISP TIS (BLADE) ×1 IMPLANT
BLADE SURG 15 STRL SS (BLADE) ×3
BNDG COHESIVE 1X5 TAN STRL LF (GAUZE/BANDAGES/DRESSINGS) ×3 IMPLANT
BNDG CONFORM 2 STRL LF (GAUZE/BANDAGES/DRESSINGS) ×3 IMPLANT
CLOTH BEACON ORANGE TIMEOUT ST (SAFETY) ×3 IMPLANT
COVER BACK TABLE 60X90IN (DRAPES) ×3 IMPLANT
COVER MAYO STAND STRL (DRAPES) ×3 IMPLANT
DRAPE PED LAPAROTOMY (DRAPES) ×3 IMPLANT
ELECT REM PT RETURN 9FT ADLT (ELECTROSURGICAL) ×3
ELECTRODE REM PT RTRN 9FT ADLT (ELECTROSURGICAL) ×1 IMPLANT
GAUZE VASELINE 1X8 (GAUZE/BANDAGES/DRESSINGS) ×3 IMPLANT
GLOVE BIO SURGEON STRL SZ8 (GLOVE) ×3 IMPLANT
GOWN STRL REUS W/ TWL XL LVL3 (GOWN DISPOSABLE) ×1 IMPLANT
GOWN STRL REUS W/TWL XL LVL3 (GOWN DISPOSABLE) ×3
GOWN W/COTTON TOWEL STD LRG (GOWNS) ×3 IMPLANT
GOWN XL W/COTTON TOWEL STD (GOWNS) ×3 IMPLANT
NEEDLE HYPO 22GX1.5 SAFETY (NEEDLE) ×3 IMPLANT
NS IRRIG 500ML POUR BTL (IV SOLUTION) ×3 IMPLANT
PACK BASIN DAY SURGERY FS (CUSTOM PROCEDURE TRAY) ×3 IMPLANT
PENCIL BUTTON HOLSTER BLD 10FT (ELECTRODE) ×3 IMPLANT
SUT CHROMIC 4 0 RB 1X27 (SUTURE) ×6 IMPLANT
SYRINGE CONTROL L 12CC (SYRINGE) ×3 IMPLANT
TOWEL OR 17X24 6PK STRL BLUE (TOWEL DISPOSABLE) ×6 IMPLANT
WATER STERILE IRR 500ML POUR (IV SOLUTION) ×3 IMPLANT

## 2015-03-22 NOTE — Discharge Instructions (Signed)
Postoperative instructions for circumcision ° °Wound: ° °Remove the dressing the morning after surgery. °In most cases your incision will have absorbable sutures that run along the course of your incision and will dissolve within the first 10-20 days. Some will fall out even earlier. Expect some redness as the sutures dissolved but this should occur only around the sutures. If there is generalized redness, especially with increasing pain or swelling, let us know. The penis will possibly get "black and blue" as the blood in the tissues spread. Sometimes the whole penis will turn colors. The black and blue is followed by a yellow and brown color. In time, all the discoloration will go away. ° °Diet: ° °You may return to your normal diet within 24 hours following your surgery. You may note some mild nausea and possibly vomiting the first 6-8 hours following surgery. This is usually due to the side effects of anesthesia, and will disappear quite soon. I would suggest clear liquids and a very light meal the first evening following your surgery. ° °Activity: ° °Your physical activity should be restricted the first 48 hours. During that time you should remain relatively inactive, moving about only when necessary. During the first 7-10 days following surgery he should avoid lifting any heavy objects (anything greater than 15 pounds), and avoid strenuous exercise. If you work, ask us specifically about your restrictions, both for work and home. We will write a note to your employer if needed. ° °Ice packs can be placed on and off over the penis for the first 48 hours to help relieve the pain and keep the swelling down. Frozen peas or corn in a ZipLock bag can be frozen, used and re-frozen. Fifteen minutes on and 15 minutes off is a reasonable schedule.  °No sexual activity for 1 month. ° °Hygiene: ° °You may shower 24 hours after your surgery. Make sure wound is clean and dry afterwards. Tub bathing should be restricted  until the seventh day. ° °Medication: ° °You will be sent home with some type of pain medication. In many cases you will be sent home with a narcotic pain pill (Vicodin or Tylox). If the pain is not too bad, you may take either Tylenol (acetaminophen) or Advil (ibuprofen) which contain no narcotic agents, and might be tolerated a little better, with fewer side effects. If the pain medication you are sent home with does not control the pain, you will have to let us know. Some narcotic pain medications cannot be given or refilled by a phone call to a pharmacy. ° °Problems you should report to us: ° °· Fever of 101.0 degrees Fahrenheit or greater. °· Moderate or severe swelling under the skin incision or involving the scrotum. °· Drug reaction such as hives, a rash, nausea or vomiting.  °· Difficulty voiding ° ° ° °Post Anesthesia Home Care Instructions ° °Activity: °Get plenty of rest for the remainder of the day. A responsible adult should stay with you for 24 hours following the procedure.  °For the next 24 hours, DO NOT: °-Drive a car °-Operate machinery °-Drink alcoholic beverages °-Take any medication unless instructed by your physician °-Make any legal decisions or sign important papers. ° °Meals: °Start with liquid foods such as gelatin or soup. Progress to regular foods as tolerated. Avoid greasy, spicy, heavy foods. If nausea and/or vomiting occur, drink only clear liquids until the nausea and/or vomiting subsides. Call your physician if vomiting continues. ° °Special Instructions/Symptoms: °Your throat may feel dry or sore   from the anesthesia or the breathing tube placed in your throat during surgery. If this causes discomfort, gargle with warm salt water. The discomfort should disappear within 24 hours. ° °If you had a scopolamine patch placed behind your ear for the management of post- operative nausea and/or vomiting: ° °1. The medication in the patch is effective for 72 hours, after which it should be  removed.  Wrap patch in a tissue and discard in the trash. Wash hands thoroughly with soap and water. °2. You may remove the patch earlier than 72 hours if you experience unpleasant side effects which may include dry mouth, dizziness or visual disturbances. °3. Avoid touching the patch. Wash your hands with soap and water after contact with the patch. °  ° °

## 2015-03-22 NOTE — Transfer of Care (Signed)
Immediate Anesthesia Transfer of Care Note  Patient: Charles Martinez  Procedure(s) Performed: Procedure(s): CIRCUMCISION ADULT (N/A)  Patient Location: PACU  Anesthesia Type:MAC  Level of Consciousness: awake, alert , oriented and patient cooperative  Airway & Oxygen Therapy: Patient Spontanous Breathing and Patient connected to nasal cannula oxygen  Post-op Assessment: Report given to RN and Post -op Vital signs reviewed and stable  Post vital signs: Reviewed and stable  Last Vitals:  Filed Vitals:   03/22/15 0845  BP: 147/82  Pulse: 63  Temp: 36.4 C  Resp: 16    Complications: No apparent anesthesia complications

## 2015-03-22 NOTE — H&P (Signed)
Urology History and Physical Exam  CC: penile pain  HPI: 64 year old male presents for circumcision.  I've seen him for quite a few years for follow-up of elevated PSA, erectile dysfunction and Peyronie's disease.  He has had significant pain with intercourse, due to tearing of his foreskin.  Evaluation recently reveal significant phimosis.  He presents at this time for circumcision.  PMH: Past Medical History  Diagnosis Date  . Dyslipidemia     intolerant to statins  . Hypertension   . Skin disorder   . Meniere's disease   . Tinnitus   . Hearing loss in left ear   . Morton's neuroma   . Phimosis   . BPH (benign prostatic hypertrophy)   . OSA (obstructive sleep apnea)     no cpap  s/p  UVPP in 2000  . Coronary artery disease due to lipid rich plaque     cardiologist-  dr Rennis Golden  . Family history of adverse reaction to anesthesia     uncle never regained responsiveness    PSH: Past Surgical History  Procedure Laterality Date  . Foot surgery  2001  . Uvulopalatopharyngoplasty    . Lumbar microdiscectomy  09-14-2010    L5 -- S1  . Transthoracic echocardiogram  03-25-2010    normal LV, ef 60-655/  mild MR and TR  . Cardiovascular stress test  11-08-2007  dr hilty    normal nuclear study/  no ischemia or infarct/ normal LV function and wall motion , ef 69%  . 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 Lasik      Allergies: Allergies  Allergen Reactions  . Bystolic [Nebivolol Hcl] Shortness Of Breath  . Hydrazine Yellow [Tartrazine] Shortness Of Breath       . Hctz [Hydrochlorothiazide]   . Lipitor [Atorvastatin] Other (See Comments)    myalgias    Medications: Prescriptions prior to admission  Medication Sig Dispense Refill Last Dose  . ACETYLCARN-ALPHA LIPOIC ACID PO Take by mouth daily.   Taking  . Ascorbic Acid (VITAMIN C PO) Take by mouth daily.   Taking  . aspirin 81 MG  tablet Take 81 mg by mouth daily.   Taking  . b complex vitamins capsule Take 1 capsule by mouth daily.   Taking  . Biotin 5000 MCG TABS Take 1 tablet by mouth daily.   Taking  . Calcium Citrate-Vitamin D (CALCIUM CITRATE + D3 PO) Take by mouth daily.   Taking  . Cholecalciferol (VITAMIN D-3) 1000 UNITS CAPS Take 2,000 Units by mouth daily.   Taking  . clonazePAM (KLONOPIN) 0.5 MG tablet Take 0.5 mg by mouth 2 (two) times daily as needed for anxiety.   Taking  . Coenzyme Q10 (CO Q-10) 100 MG CAPS Take 1 capsule by mouth daily.   Taking  . finasteride (PROSCAR) 5 MG tablet Take 5 mg by mouth daily.   Taking  . gabapentin (NEURONTIN) 300 MG capsule Take 300 mg by mouth as needed.   Taking  . magnesium oxide (MAG-OX) 400 MG tablet Take 400 mg by mouth daily.   Taking  . NIACIN PO Take by mouth daily.   Taking  . potassium chloride SA (K-DUR,KLOR-CON) 20 MEQ tablet Take 20 mEq by mouth daily.   Taking  . ramipril (ALTACE) 10 MG capsule Take 10 mg by mouth daily.   Taking  . Red Yeast Rice 600 MG CAPS Take  1,200 mg by mouth 2 (two) times daily.   Taking  . spironolactone (ALDACTONE) 50 MG tablet Take 50 mg by mouth daily.   Taking  . vitamin B-12 (CYANOCOBALAMIN) 1000 MCG tablet Take 1,000 mcg by mouth daily.   Taking     Social History: Social History   Social History  . Marital Status: Married    Spouse Name: N/A  . Number of Children: N/A  . Years of Education: N/A   Occupational History  . Not on file.   Social History Main Topics  . Smoking status: Former Smoker    Quit date: 04/25/1995  . Smokeless tobacco: Never Used  . Alcohol Use: Yes     Comment: occasionally  . Drug Use: No  . Sexual Activity: Not on file   Other Topics Concern  . Not on file   Social History Narrative    Family History: History reviewed. No pertinent family history.  Review of Systems: Positive: penile pain, curvature, erectile dysfunction Negative: .  A further 10 point review of systems was  negative except what is listed in the HPI.                  Physical Exam: @ General: No acute distress.  Awake. Head:  Normocephalic.  Atraumatic. ENT:  EOMI.  Mucous membranes moist Neck:  Supple.  No lymphadenopathy. CV:  S1 present. S2 present. Regular rate. Pulmonary: Equal effort bilaterally.  Clear to auscultation bilaterally. Abdomen: Soft.  nonetender to palpation. Skin:  Normal turgor.  No visible rash. Extremity: No gross deformity of bilateral upper extremities.  No gross deformity of                             lower extremities. Neurologic: Alert. Appropriate mood.  Penis:  Uncircumcised.  No lesions.phimosis Urethra: Orthotopic meatus. Scrotum: No lesions.  No ecchymosis.  No erythema. Testicles: Descended bilaterally.  No masses bilaterally. Epididymis: Palpable bilaterally. Nontender to palpation.  Studies:  No results for input(s): HGB, WBC, PLT in the last 72 hours.  No results for input(s): NA, K, CL, CO2, BUN, CREATININE, CALCIUM, GFRNONAA, GFRAA in the last 72 hours.  Invalid input(s): MAGNESIUM   No results for input(s): INR, APTT in the last 72 hours.  Invalid input(s): PT   Invalid input(s): ABG    Assessment:  Phimosis, symptomatic  Plan: circumcision

## 2015-03-22 NOTE — Anesthesia Procedure Notes (Signed)
Procedure Name: MAC Date/Time: 03/22/2015 10:00 AM Performed by: Tyrone Nine Pre-anesthesia Checklist: Patient identified, Emergency Drugs available, Suction available, Patient being monitored and Timeout performed Oxygen Delivery Method: Simple face mask Intubation Type: IV induction Dental Injury: Teeth and Oropharynx as per pre-operative assessment

## 2015-03-22 NOTE — Anesthesia Postprocedure Evaluation (Signed)
  Anesthesia Post-op Note  Patient: Charles Martinez  Procedure(s) Performed: Procedure(s) (LRB): CIRCUMCISION ADULT (N/A)  Patient Location: PACU  Anesthesia Type: MAC  Level of Consciousness: awake and alert   Airway and Oxygen Therapy: Patient Spontanous Breathing  Post-op Pain: mild  Post-op Assessment: Post-op Vital signs reviewed, Patient's Cardiovascular Status Stable, Respiratory Function Stable, Patent Airway and No signs of Nausea or vomiting  Last Vitals:  Filed Vitals:   03/22/15 1130  BP: 121/70  Pulse: 67  Temp:   Resp: 15    Post-op Vital Signs: stable   Complications: No apparent anesthesia complications

## 2015-03-22 NOTE — Anesthesia Preprocedure Evaluation (Signed)
Anesthesia Evaluation  Patient identified by MRN, date of birth, ID band Patient awake    Reviewed: Allergy & Precautions, H&P , NPO status , Patient's Chart, lab work & pertinent test results  History of Anesthesia Complications Negative for: history of anesthetic complications  Airway Mallampati: I  TM Distance: >3 FB Neck ROM: full   Comment: Very prominent beard Dental no notable dental hx.    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 that doesn't appear to affect him clinically   Neuro/Psych negative neurological ROS     GI/Hepatic negative GI ROS, Neg liver ROS,   Endo/Other  negative endocrine ROS  Renal/GU negative Renal ROS     Musculoskeletal   Abdominal   Peds  Hematology negative hematology ROS (+)   Anesthesia Other Findings   Reproductive/Obstetrics negative OB ROS                             Anesthesia Physical Anesthesia Plan  ASA: II  Anesthesia Plan: MAC   Post-op Pain Management:    Induction: Intravenous  Airway Management Planned: Simple Face Mask  Additional Equipment:   Intra-op Plan:   Post-operative Plan:   Informed Consent: I have reviewed the patients History and Physical, chart, labs and discussed the procedure including the risks, benefits and alternatives for the proposed anesthesia with the patient or authorized representative who has indicated his/her understanding and acceptance.   Dental Advisory Given  Plan Discussed with: Anesthesiologist, CRNA and Surgeon  Anesthesia  Plan Comments: (Propofol bolus for penile block)        Anesthesia Quick Evaluation

## 2015-03-22 NOTE — Op Note (Signed)
Preoperative diagnosis: Phimosis Postoperative diagnosis: Same  Procedure: Circumcision Surgeon: Ronell Boldin M. Kaven Cumbie, MD. Anesthesia: Gen. with penile block Specimen: foreskin Complications: none EBL:minimal  Indications: The patient was recently evaluated for phimosis. All risks and benefits of circumcision discussed. Full informed consent obtained. The patient now presents for definitive procedure.  Technique and findings: The patient was brought to the operating room. Successful induction of general anesthesia.  The patient was then prepped and draped in usual manner. Appropriate surgical timeout was performed. A penile block was then performed with 20cc of quarter percent plain Marcaine. Proximal and distal incision sites were marked with a pen, and appropriate circumferential incisions were created. The sleeve of redundant skin was removed with the bovie. Hemostatis was achieved using electrocautery.Quadrant sutures were placed with interrupted 4-0 chromic, with the phrenular suture being a "U" stitch. In between, the same 4-0 chromic was used to re approximate the skin edges with a running simple stitch.  The incision was wrapped with Xeroform gauze, a plain guaze wrap and Coban dressing. The patient was brought to recovery room in stable condition having had no obvious complications or problems. Sponge and needle counts were correct.       

## 2015-03-23 ENCOUNTER — Encounter (HOSPITAL_BASED_OUTPATIENT_CLINIC_OR_DEPARTMENT_OTHER): Payer: Self-pay | Admitting: Urology

## 2015-04-21 ENCOUNTER — Encounter: Payer: Self-pay | Admitting: Internal Medicine

## 2015-04-26 ENCOUNTER — Ambulatory Visit (INDEPENDENT_AMBULATORY_CARE_PROVIDER_SITE_OTHER): Payer: Medicare Other | Admitting: Internal Medicine

## 2015-04-26 ENCOUNTER — Encounter: Payer: Self-pay | Admitting: Internal Medicine

## 2015-04-26 VITALS — BP 128/86 | HR 69 | Ht 70.0 in | Wt 201.2 lb

## 2015-04-26 DIAGNOSIS — I2510 Atherosclerotic heart disease of native coronary artery without angina pectoris: Secondary | ICD-10-CM | POA: Diagnosis not present

## 2015-04-26 DIAGNOSIS — I10 Essential (primary) hypertension: Secondary | ICD-10-CM | POA: Diagnosis not present

## 2015-04-26 DIAGNOSIS — E785 Hyperlipidemia, unspecified: Secondary | ICD-10-CM

## 2015-04-26 DIAGNOSIS — I2583 Coronary atherosclerosis due to lipid rich plaque: Secondary | ICD-10-CM

## 2015-04-26 MED ORDER — RAMIPRIL 10 MG PO CAPS: 10.0000 mg | ORAL_CAPSULE | Freq: Every day | ORAL | Status: DC

## 2015-04-26 NOTE — Progress Notes (Signed)
OFFICE NOTE  Chief Complaint:  Routine follow-up, no complaints  Primary Care Physician: [facility]  HPI:  Charles Martinez is a 64 year old gentleman with a history of dyslipidemia, hypokalemia, hypertension, and possibly either occluded distal LAD or congenitally occluded LAD distally. He has had problems with sciatic pain and recently had back surgery with marked improvement. He has also had dyslipidemia but has been intolerant to statins and is only fenofibrate at this time. After that episode in 2011 with hypertension and hypokalemia he was on a number of blood pressure medicines. However, recently he has been feeling fatigued and dizzy. He checked his blood pressure and it was in the 80s systolic and he has since then cut back on his medicines. He has discontinued labetalol altogether and previously was on 200 b.i.d. He recently stopped his hydralazine due to low blood pressure and is only on ramipril 10 mg and aspirin 81 mg daily. He also takes Aldactone 50 mg daily. He has no complaints as far as chest pain, worsening shortness of breath, palpitations, presyncope, or syncopal symptoms.   Charles Martinez returns today for follow-up. He denies any chest pain or worsening shortness of breath. He has had a couple episodes where he felt that he was not in often quickly woke up. This does not happen while driving but he did have an episode about 2 years ago. We have been decreasing his blood pressure medications although seems to be stable now only on ramipril and Aldactone.  I saw Charles Martinez back today in the office. Overall he seems to be doing fairly well. He continues to have problems with dizziness which are related to inner ear issues. Blood pressure appears to be well-controlled today 128/86. In fact it is been fairly stable. Previously had been on a lot of blood pressure medicine and then he started to cut the medicine back after what sounded like a syncopal  episode. This happened while driving, but he's had no further episodes since this last episode. He denies any chest pain or worsening shortness of breath. Recently had cholesterol profile obtained which shows a elevated cholesterol and LDL greater than 130. He reports he is not interested in taking cholesterol medication because he really does not feel that cholesterol medications are beneficial.  PMHx:  Past Medical History  Diagnosis Date  . Dyslipidemia     intolerant to statins  . Hypertension   . Skin disorder   . Meniere's disease   . Tinnitus   . Hearing loss in left ear   . Morton's neuroma   . Phimosis   . BPH (benign prostatic hypertrophy)   . OSA (obstructive sleep apnea)     no cpap  s/p  UVPP in 2000  . Coronary artery disease due to lipid rich plaque     cardiologist-  dr Rennis Golden  . Family history of adverse reaction to anesthesia     uncle never regained responsiveness    Past Surgical History  Procedure Laterality Date  . Foot surgery  2001  . Uvulopalatopharyngoplasty    . Lumbar microdiscectomy  09-14-2010    L5 -- S1  . Transthoracic echocardiogram  03-25-2010    normal LV, ef 60-655/  mild MR and TR  . Cardiovascular stress test  11-08-2007  dr Leonette Tischer    normal nuclear study/  no ischemia or infarct/ normal LV function and wall motion , ef 69%  . 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 Lasik    . Circumcision N/A 03/22/2015    Procedure: CIRCUMCISION ADULT;  Surgeon: Marcine MatarStephen Dahlstedt, MD;  Location: [facility] ;  Service: Urology;  Laterality: N/A;    FAMHx:  Family History  Problem Relation Age of Onset  . Intracerebral hemorrhage Father     SOCHx:   reports that he quit smoking about 20 years ago. He has never used smokeless tobacco. He reports that he drinks alcohol. He reports that he does not use illicit drugs.  ALLERGIES:  Allergies  Allergen  Reactions  . Bystolic [Nebivolol Hcl] Shortness Of Breath  . Hydrazine Yellow [Tartrazine] Shortness Of Breath       . Hctz [Hydrochlorothiazide]   . Lipitor [Atorvastatin] Other (See Comments)    myalgias    ROS: A comprehensive review of systems was negative.  HOME MEDS: Current Outpatient Prescriptions  Medication Sig Dispense Refill  . ACETYLCARN-ALPHA LIPOIC ACID PO Take by mouth daily.    . Ascorbic Acid (VITAMIN C PO) Take by mouth daily.    Marland Kitchen. aspirin 81 MG tablet Take 81 mg by mouth daily.    Marland Kitchen. b complex vitamins capsule Take 1 capsule by mouth daily.    . Biotin 5000 MCG TABS Take 1 tablet by mouth daily.    . Calcium Citrate-Vitamin D (CALCIUM CITRATE + D3 PO) Take by mouth daily.    . Cholecalciferol (VITAMIN D-3) 1000 UNITS CAPS Take 2,000 Units by mouth daily.    . clonazePAM (KLONOPIN) 0.5 MG tablet Take 0.5 mg by mouth 2 (two) times daily as needed for anxiety.    . Coenzyme Q10 (CO Q-10) 100 MG CAPS Take 1 capsule by mouth daily.    . finasteride (PROSCAR) 5 MG tablet Take 5 mg by mouth daily.    Marland Kitchen. gabapentin (NEURONTIN) 300 MG capsule Take 300 mg by mouth as needed.    . Lutein-Zeaxanthin 20-0.8 MG CAPS Take 1 capsule by mouth every morning.    . magnesium oxide (MAG-OX) 400 MG tablet Take 400 mg by mouth daily.    Marland Kitchen. NIACIN PO Take by mouth daily.    . potassium chloride SA (K-DUR,KLOR-CON) 20 MEQ tablet Take 20 mEq by mouth daily.    . ramipril (ALTACE) 10 MG capsule Take 1 capsule (10 mg total) by mouth daily. 90 capsule 3  . Red Yeast Rice 600 MG CAPS Take 1,200 mg by mouth 2 (two) times daily.    Marland Kitchen. spironolactone (ALDACTONE) 50 MG tablet Take 50 mg by mouth daily.    . vitamin B-12 (CYANOCOBALAMIN) 1000 MCG tablet Take 1,000 mcg by mouth daily.     No current facility-administered medications for this visit.    LABS/IMAGING: No results found for this or any previous visit (from the past 48 hour(s)). No results found.  VITALS: BP 128/86 mmHg  Pulse 69   Ht 5\' 10"  (1.778 m)  Wt 201 lb 3 oz (91.258 kg)  BMI 28.87 kg/m2  EXAM: General appearance: alert and no distress Neck: no carotid bruit and no JVD Lungs: clear to auscultation bilaterally Heart: regular rate and rhythm, S1, S2 normal, no murmur, click, rub or gallop Abdomen: soft, non-tender; bowel sounds normal; no masses,  no organomegaly Extremities: extremities normal, atraumatic, no cyanosis or edema Pulses: 2+ and symmetric Skin: Skin color, texture, turgor normal. No rashes or lesions Neurologic: Grossly normal Psych: Mood, affect normal  EKG: Normal sinus rhythm at 69  ASSESSMENT:  1. Hypertension-well controlled 2. Lower extremity edema 3. Mild anxiety 4. Dyslipidemia - unwilling to take statins 5. 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   Charles Martinez seems to have stable blood pressure in his current regimen. Cholesterol remains too high for someone with known coronary artery disease however he is unwilling to take statins and he feels that they are not beneficial, in fact he is concerned about issues with harm. We did discuss about a statin alternative such as considering ezetimibe. I offered repeat cholesterol testing and he wants to defer that until he sees his primary again. I would submit if his cholesterol remains elevated with LDLs greater than 100, then we should consider starting him on ezetimibe if he is willing to take it. He could continue Red Yeast Rice as well (which does contain a natural statin).  Plan to see him back annually or sooner as necessary.  Chrystie Nose, MD, [facility] Attending Cardiologist [facility]  Chrystie Nose 04/26/2015, 1:10 PM'

## 2015-04-26 NOTE — Patient Instructions (Signed)
Your physician wants you to follow-up in: 12 month with Dr. Rennis GoldenHilty. You will receive a reminder letter in the mail two months in advance. If you don't receive a letter, please call our office to schedule the follow-up appointment.

## 2016-06-21 ENCOUNTER — Encounter: Payer: Self-pay | Admitting: Internal Medicine

## 2016-06-21 ENCOUNTER — Ambulatory Visit (INDEPENDENT_AMBULATORY_CARE_PROVIDER_SITE_OTHER): Payer: Medicare Other | Admitting: Internal Medicine

## 2016-06-21 VITALS — BP 126/84 | HR 80 | Ht 70.0 in | Wt 206.2 lb

## 2016-06-21 DIAGNOSIS — I10 Essential (primary) hypertension: Secondary | ICD-10-CM

## 2016-06-21 DIAGNOSIS — I2510 Atherosclerotic heart disease of native coronary artery without angina pectoris: Secondary | ICD-10-CM

## 2016-06-21 DIAGNOSIS — E785 Hyperlipidemia, unspecified: Secondary | ICD-10-CM | POA: Diagnosis not present

## 2016-06-21 DIAGNOSIS — Z789 Other specified health status: Secondary | ICD-10-CM | POA: Diagnosis not present

## 2016-06-21 DIAGNOSIS — Z79899 Other long term (current) drug therapy: Secondary | ICD-10-CM

## 2016-06-21 DIAGNOSIS — I2583 Coronary atherosclerosis due to lipid rich plaque: Secondary | ICD-10-CM

## 2016-06-21 MED ORDER — RAMIPRIL 10 MG PO CAPS: 10.0000 mg | ORAL_CAPSULE | Freq: Every day | ORAL | 3 refills | Status: AC

## 2016-06-21 NOTE — Progress Notes (Signed)
OFFICE NOTE  Chief Complaint:  Routine follow-up, no complaints  Primary Care Physician[facility]  HPI:  Charles Martinez is a 66 year old gentleman with a history of dyslipidemia, hypokalemia, hypertension, and possibly either occluded distal LAD or congenitally occluded LAD distally. He has had problems with sciatic pain and recently had back surgery with marked improvement. He has also had dyslipidemia but has been intolerant to statins and is only fenofibrate at this time. After that episode in 2011 with hypertension and hypokalemia he was on a number of blood pressure medicines. However, recently he has been feeling fatigued and dizzy. He checked his blood pressure and it was in the 80s systolic and he has since then cut back on his medicines. He has discontinued labetalol altogether and previously was on 200 b.i.d. He recently stopped his hydralazine due to low blood pressure and is only on ramipril 10 mg and aspirin 81 mg daily. He also takes Aldactone 50 mg daily. He has no complaints as far as chest pain, worsening shortness of breath, palpitations, presyncope, or syncopal symptoms.   Charles Martinez returns today for follow-up. He denies any chest pain or worsening shortness of breath. He has had a couple episodes where he felt that he was not in often quickly woke up. This does not happen while driving but he did have an episode about 2 years ago. We have been decreasing his blood pressure medications although seems to be stable now only on ramipril and Aldactone.  I saw Charles Martinez back today in the office. Overall he seems to be doing fairly well. He continues to have problems with dizziness which are related to inner ear issues. Blood pressure appears to be well-controlled today 128/86. In fact it is been fairly stable. Previously had been on a lot of blood pressure medicine and then he started to cut the medicine back after what sounded like a syncopal  episode. This happened while driving, but he's had no further episodes since this last episode. He denies any chest pain or worsening shortness of breath. Recently had cholesterol profile obtained which shows a elevated cholesterol and LDL greater than 130. He reports he is not interested in taking cholesterol medication because he really does not feel that cholesterol medications are beneficial.  06/21/2016  Charles Martinez was seen today in follow-up. He seems to be doing fairly well. He denies any chest pain or worsening shortness of breath. His most recent cholesterol profile from 2016 indicated that LDL-C was 168. He has not been on cholesterol medication due to intolerance in the past. His previously been on Lipitor and Crestor and developed myalgias. 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 I discussed options with him today including the possibility of enrolling in the Orion-10 trial to evaluate a novel siRNA that inhibits PC SK 9 receptor expression and can significantly lower cholesterol. He did seem interested in this study.  PMHx:  Past Medical History:  Diagnosis Date  . BPH (benign prostatic hypertrophy)   . Coronary artery disease due to lipid rich plaque    cardiologist-  dr Rennis Goldenhilty  . Dyslipidemia    intolerant to statins  . Family history of adverse reaction to anesthesia    uncle never regained responsiveness  . Hearing loss in left ear   . Hypertension   .  Meniere's disease   . Morton's neuroma   . OSA (obstructive sleep apnea)    no cpap  s/p  UVPP in 2000  . Phimosis   . Skin disorder   . Tinnitus     Past Surgical History:  Procedure Laterality Date  . 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 CARDIOVASCULAR STRESS TEST  11-08-2007   dr Crystalina Stodghill   normal nuclear study/  no ischemia or infarct/ normal LV function and wall motion , ef 69%  . CIRCUMCISION N/A 03/22/2015   Procedure: CIRCUMCISION ADULT;  Surgeon: Marcine Matar, MD;  Location: [facility];  Service: Urology;  Laterality: N/A;  . FOOT SURGERY  2001  . LASIK    . LUMBAR MICRODISCECTOMY  09-14-2010   L5 -- S1  . TRANSTHORACIC ECHOCARDIOGRAM  03-25-2010   normal LV, ef 60-655/  mild MR and TR  . UVULOPALATOPHARYNGOPLASTY      FAMHx:  Family History  Problem Relation Age of Onset  . Intracerebral hemorrhage Father     SOCHx:   reports that he quit smoking about 21 years ago. He has never used smokeless tobacco. He reports that he drinks alcohol. He reports that he does not use drugs.  ALLERGIES:  Allergies  Allergen Reactions  . Bystolic [Nebivolol Hcl] Shortness Of Breath  . Hydrazine Yellow [Tartrazine] Shortness Of Breath       . Hctz [Hydrochlorothiazide]   . Lipitor [Atorvastatin] Other (See Comments)    myalgias    ROS: A comprehensive review of systems was negative.  HOME MEDS: Current Outpatient Prescriptions  Medication Sig Dispense Refill  . ACETYLCARN-ALPHA LIPOIC ACID PO Take by mouth daily.    . Ascorbic Acid (VITAMIN C PO) Take by mouth daily.    Marland Kitchen aspirin 81 MG tablet Take 81 mg by mouth daily.    . Biotin 5000 MCG TABS Take 1 tablet by mouth daily.    . Calcium Citrate-Vitamin D (CALCIUM CITRATE + D3 PO) Take by mouth daily.    . Cholecalciferol (VITAMIN D-3) 1000 UNITS CAPS Take 2,000 Units by mouth daily.    . clonazePAM (KLONOPIN) 0.5 MG tablet Take 0.5 mg by mouth 2 (two) times daily as needed for anxiety.    . Coenzyme Q10 (CO Q-10) 100 MG CAPS Take 1 capsule by mouth daily.    . Cyanocobalamin (B-12) 1000 MCG CAPS Take 1 capsule by mouth daily.    . finasteride (PROSCAR) 5 MG tablet Take 5 mg by mouth daily.    . Lutein-Zeaxanthin 20-0.8 MG CAPS Take 1 capsule by mouth every morning.    . magnesium  oxide (MAG-OX) 400 MG tablet Take 400 mg by mouth daily.    . Pyridoxine HCl (B-6) 100 MG TABS Take 1 tablet by mouth daily.    . ramipril (ALTACE) 10 MG capsule Take 1 capsule (10 mg total) by mouth daily. 90 capsule 3  . spironolactone (ALDACTONE) 50 MG tablet Take 50 mg by mouth daily.    Marland Kitchen thiamine (VITAMIN B-1) 100 MG tablet Take 100 mg by mouth daily.     No current facility-administered medications for this visit.     LABS/IMAGING: No results found for this or any previous visit (from the past 48 hour(s)). No results found.  VITALS: BP 126/84 (BP Location: Left Arm, Patient Position: Sitting, Cuff Size: Normal)   Pulse 80   Ht 5\' 10"  (1.778 m)  Wt 206 lb 4 oz (93.6 kg)   BMI 29.59 kg/m   EXAM: General appearance: alert and no distress Neck: no carotid bruit and no JVD Lungs: clear to auscultation bilaterally Heart: regular rate and rhythm, S1, S2 normal, no murmur, click, rub or gallop Abdomen: soft, non-tender; bowel sounds normal; no masses,  no organomegaly Extremities: extremities normal, atraumatic, no cyanosis or edema Pulses: 2+ and symmetric Skin: Skin color, texture, turgor normal. No rashes or lesions Neurologic: Grossly normal Psych: Mood, affect normal  EKG: Normal sinus rhythm at 81  ASSESSMENT: 1. Hypertension-well controlled 2. Lower extremity edema 3. Mild anxiety 4. Dyslipidemia - unwilling to take statins, myopathy with Lipitor and Crestor in the past 5. 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   Mr. Christopherson has coronary artery disease with distal LAD occlusion. His LDL is greater than 160. He will need a marked improvement in cholesterol. A repeat lipid profile is overdue and I'll order that today along with metabolic profile. He does appear interested in the Orion-10 trial. I will contact our research nurses regarding this. Otherwise blood pressure is well controlled and we'll continue his REM Pro with a  refill today. Follow-up with me annually or sooner as necessary.  Chrystie Nose, MD, [facility] Attending Cardiologist [facility]  Chrystie Nose 06/21/2016, 10:14 AM'

## 2016-06-21 NOTE — Patient Instructions (Addendum)
Your physician recommends that you return for lab work FASTING (nothing to eat/drink after midnight) -- CMET, lipid  Your physician wants you to follow-up in: ONE YEAR with Dr. Rennis GoldenHilty. You will receive a reminder letter in the mail two months in advance. If you don't receive a letter, please call our office to schedule the follow-up appointment.

## 2016-07-04 ENCOUNTER — Encounter: Payer: Self-pay | Admitting: *Deleted

## 2016-07-04 DIAGNOSIS — Z006 Encounter for examination for normal comparison and control in clinical research program: Secondary | ICD-10-CM

## 2016-07-04 NOTE — Progress Notes (Signed)
Orion-10 Study All elements of the informed consent form,study requirements and expectations were reviewed with the patient, and all questions and concerns were identified and addressed prior to signing of the consent. No procedures were performed prior to consenting the patient.The patient was given an adequate amount of time to make an informed decision. A copy of the informed consent was provided to the patient to take home.  07/04/16 10:40am

## 2016-07-11 ENCOUNTER — Encounter: Payer: Self-pay | Admitting: *Deleted

## 2016-07-11 ENCOUNTER — Other Ambulatory Visit: Payer: Self-pay | Admitting: *Deleted

## 2016-07-11 ENCOUNTER — Encounter (HOSPITAL_COMMUNITY): Payer: Self-pay | Admitting: Nurse Practitioner

## 2016-07-11 DIAGNOSIS — Z006 Encounter for examination for normal comparison and control in clinical research program: Secondary | ICD-10-CM

## 2016-07-11 MED ORDER — AMBULATORY NON FORMULARY MEDICATION: 300.0000 mg | Status: DC

## 2016-07-11 NOTE — Progress Notes (Signed)
Orion-10 Study  Patient was randomized to the Orion-10 Study. Patient received injection of Inclirisan sodium injection vs placebo injection. Patient tolerated the injection well.

## 2016-07-14 ENCOUNTER — Telehealth: Payer: Self-pay | Admitting: *Deleted

## 2016-07-14 NOTE — Telephone Encounter (Signed)
Patient was notified by phone that potassium was 5.2 mmol/L which is elevated and HgA1c is 6.2. I will send labs to primary physician for follow-up.Dr.Stuckey reviewed labs and asked labs to be sent to primary doctor for review.

## 2016-08-10 ENCOUNTER — Encounter: Payer: Self-pay | Admitting: *Deleted

## 2016-08-10 NOTE — Progress Notes (Unsigned)
Orion-10 Study I saw patient for 30-day visit . Patient is doing well and I will see patient in 60 days for next visit.

## 2016-10-11 DIAGNOSIS — Z006 Encounter for examination for normal comparison and control in clinical research program: Secondary | ICD-10-CM

## 2016-10-12 NOTE — Progress Notes (Unsigned)
Late entry: Patient came to Research clinic for V3-90Days for the [facility] Research study. Had no complaints or AEs to report.  Received injection and remained with us for the required time. V4-Day 150 appointment scheduled.

## 2016-12-11 ENCOUNTER — Encounter: Payer: Self-pay | Admitting: *Deleted

## 2016-12-11 ENCOUNTER — Encounter: Payer: Self-pay | Admitting: Cardiology

## 2016-12-11 DIAGNOSIS — Z006 Encounter for examination for normal comparison and control in clinical research program: Secondary | ICD-10-CM

## 2016-12-11 NOTE — Progress Notes (Signed)
I saw patient for Charles Martinez day study visit. Patient is doing well with no complaints.I will see patient for 270 day visit on October 22,2018 at 10:00am.

## 2017-04-02 ENCOUNTER — Encounter: Payer: Self-pay | Admitting: *Deleted

## 2017-04-02 DIAGNOSIS — Z006 Encounter for examination for normal comparison and control in clinical research program: Secondary | ICD-10-CM

## 2017-04-02 NOTE — Progress Notes (Signed)
Subject to Research clinic for V5 Day 270 in the [facility] research study.  No c/o, aes or saes to report.  Subject given injection.  Next appointment scheduled for  May 28, 2017.

## 2017-05-28 ENCOUNTER — Encounter: Payer: Self-pay | Admitting: *Deleted

## 2017-05-28 DIAGNOSIS — Z006 Encounter for examination for normal comparison and control in clinical research program: Secondary | ICD-10-CM

## 2017-05-28 NOTE — Progress Notes (Signed)
Patient to Research clinic for Visit 6 Day 330 in the [place].  No c/o, aes or saes to report.  Next appointment scheduled for October 04, 2017 @1000 .

## 2017-06-22 ENCOUNTER — Encounter: Payer: Self-pay | Admitting: Internal Medicine

## 2017-06-22 ENCOUNTER — Ambulatory Visit (INDEPENDENT_AMBULATORY_CARE_PROVIDER_SITE_OTHER): Payer: Medicare Other | Admitting: Internal Medicine

## 2017-06-22 VITALS — BP 111/72 | HR 69 | Resp 16 | Ht 69.0 in | Wt 209.0 lb

## 2017-06-22 DIAGNOSIS — I10 Essential (primary) hypertension: Secondary | ICD-10-CM

## 2017-06-22 DIAGNOSIS — I2510 Atherosclerotic heart disease of native coronary artery without angina pectoris: Secondary | ICD-10-CM | POA: Diagnosis not present

## 2017-06-22 DIAGNOSIS — Z789 Other specified health status: Secondary | ICD-10-CM

## 2017-06-22 DIAGNOSIS — I2583 Coronary atherosclerosis due to lipid rich plaque: Secondary | ICD-10-CM | POA: Diagnosis not present

## 2017-06-22 DIAGNOSIS — E785 Hyperlipidemia, unspecified: Secondary | ICD-10-CM

## 2017-06-22 DIAGNOSIS — Z006 Encounter for examination for normal comparison and control in clinical research program: Secondary | ICD-10-CM

## 2017-06-22 NOTE — Patient Instructions (Signed)
Your physician wants you to follow-up in: 6 months with Dr. Hilty. You will receive a reminder letter in the mail two months in advance. If you don't receive a letter, please call our office to schedule the follow-up appointment.    

## 2017-06-22 NOTE — Progress Notes (Signed)
OFFICE NOTE  Chief Complaint:  Annual follow-up  Primary Care Physician: [facility]  HPI:  Charles Martinez is a 67 year old gentleman with a history of dyslipidemia, hypokalemia, hypertension, and possibly either occluded distal LAD or congenitally occluded LAD distally. He has had problems with sciatic pain and recently had back surgery with marked improvement. He has also had dyslipidemia but has been intolerant to statins and is only fenofibrate at this time. After that episode in 2011 with hypertension and hypokalemia he was on a number of blood pressure medicines. However, recently he has been feeling fatigued and dizzy. He checked his blood pressure and it was in the 80s systolic and he has since then cut back on his medicines. He has discontinued labetalol altogether and previously was on 200 b.i.d. He recently stopped his hydralazine due to low blood pressure and is only on ramipril 10 mg and aspirin 81 mg daily. He also takes Aldactone 50 mg daily. He has no complaints as far as chest pain, worsening shortness of breath, palpitations, presyncope, or syncopal symptoms.   Charles Martinez returns today for follow-up. He denies any chest pain or worsening shortness of breath. He has had a couple episodes where he felt that he was not in often quickly woke up. This does not happen while driving but he did have an episode about 2 years ago. We have been decreasing his blood pressure medications although seems to be stable now only on ramipril and Aldactone.  I saw Charles Martinez back today in the office. Overall he seems to be doing fairly well. He continues to have problems with dizziness which are related to inner ear issues. Blood pressure appears to be well-controlled today 128/86. In fact it is been fairly stable. Previously had been on a lot of blood pressure medicine and then he started to cut the medicine back after what sounded like a syncopal episode. This happened  while driving, but he's had no further episodes since this last episode. He denies any chest pain or worsening shortness of breath. Recently had cholesterol profile obtained which shows a elevated cholesterol and LDL greater than 130. He reports he is not interested in taking cholesterol medication because he really does not feel that cholesterol medications are beneficial.  06/21/2016  Charles Martinez was seen today in follow-up. He seems to be doing fairly well. He denies any chest pain or worsening shortness of breath. His most recent cholesterol profile from 2016 indicated that LDL-C was 168. He has not been on cholesterol medication due to intolerance in the past. His previously been on Lipitor and Crestor and developed myalgias. 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 I discussed options with him today including the possibility of enrolling in the Orion-10 trial to evaluate a novel siRNA that inhibits PC SK 9 receptor expression and can significantly lower cholesterol. He did seem interested in this study.  06/22/2017  Charles Martinez was seen today for yearly follow-up.  Over the past year he is done well denies any chest pain or worsening shortness of breath.  He has been involved in the [employer] study.  This is scheduled to conclude with his follow-up visit in April 2019.  Unfortunately he is blinded and we are not sure whether or not he is receiving therapy or not, but seems  to be doing well.  Blood pressure is adequately controlled today.  EKG shows no ischemic changes.  PMHx:  Past Medical History:  Diagnosis Date  . BPH (benign prostatic hypertrophy)   . Coronary artery disease due to lipid rich plaque    cardiologist-  dr Rennis Golden  . Dyslipidemia    intolerant to statins  . Family history of adverse reaction to anesthesia    uncle  never regained responsiveness  . Hearing loss in left ear   . Hypertension   . Meniere's disease   . Morton's neuroma   . OSA (obstructive sleep apnea)    no cpap  s/p  UVPP in 2000  . Phimosis   . Skin disorder   . Tinnitus     Past Surgical History:  Procedure Laterality Date  . 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 CARDIOVASCULAR STRESS TEST  11-08-2007  dr Nijae Doyel   normal nuclear study/  no ischemia or infarct/ normal LV function and wall motion , ef 69%  . CIRCUMCISION N/A 03/22/2015   Procedure: CIRCUMCISION ADULT;  Surgeon: Marcine Matar, MD;  Location: [facility];  Service: Urology;  Laterality: N/A;  . FOOT SURGERY  2001  . LASIK    . LUMBAR MICRODISCECTOMY  09-14-2010   L5 -- S1  . TRANSTHORACIC ECHOCARDIOGRAM  03-25-2010   normal LV, ef 60-655/  mild MR and TR  . UVULOPALATOPHARYNGOPLASTY      FAMHx:  Family History  Problem Relation Age of Onset  . Intracerebral hemorrhage Father     SOCHx:   reports that he quit smoking about 22 years ago. he has never used smokeless tobacco. He reports that he drinks alcohol. He reports that he does not use drugs.  ALLERGIES:  Allergies  Allergen Reactions  . Bystolic [Nebivolol Hcl] Shortness Of Breath  . Hydrazine Yellow [Tartrazine] Shortness Of Breath       . Crestor [Rosuvastatin Calcium] Other (See Comments)    Myalgias   . Doxycycline Other (See Comments)    Other  . Hctz [Hydrochlorothiazide]   . Lipitor [Atorvastatin] Other (See Comments)    myalgias    ROS: Pertinent items noted in HPI and remainder of comprehensive ROS otherwise negative.  HOME MEDS: Current Outpatient Medications  Medication Sig Dispense Refill  . ACETYLCARN-ALPHA LIPOIC ACID PO Take by mouth daily.    . AMBULATORY NON FORMULARY MEDICATION Inject 300 mg into the skin as directed. Medication Name:Inclirisan sodium vs  placebo-Orion-10 Study-Medicine provided per protocol    . Ascorbic Acid (VITAMIN C PO) Take by mouth daily.    Marland Kitchen aspirin 81 MG tablet Take 81 mg by mouth daily.    . B Complex Vitamins (B COMPLEX 100 PO) Take 1 tablet by mouth daily.    . Biotin 5000 MCG TABS Take 1 tablet by mouth daily.    . Calcium Citrate-Vitamin D (CALCIUM CITRATE + D3 PO) Take by mouth daily.    . Cholecalciferol (VITAMIN D-3) 1000 UNITS CAPS Take 2,000 Units by mouth daily.    . clonazePAM (KLONOPIN) 0.5 MG tablet Take 0.5 mg by mouth 2 (two) times daily as needed for anxiety.    Marland Kitchen Cod Liver Oil 1000 MG CAPS Take 1 capsule by mouth daily.    . Coenzyme Q10 (CO Q-10) 100 MG CAPS Take 1 capsule by mouth daily.    . finasteride (PROSCAR) 5 MG tablet Take 5  mg by mouth daily.    . Lutein-Zeaxanthin 20-0.8 MG CAPS Take 1 capsule by mouth every morning.    . magnesium oxide (MAG-OX) 400 MG tablet Take 400 mg by mouth daily.    . ramipril (ALTACE) 10 MG capsule Take 1 capsule (10 mg total) by mouth daily. 90 capsule 3  . spironolactone (ALDACTONE) 50 MG tablet Take 50 mg by mouth daily.     No current facility-administered medications for this visit.     LABS/IMAGING: No results found for this or any previous visit (from the past 48 hour(s)). No results found.  VITALS: BP 111/72   Pulse 69   Resp 16   Ht 5\' 9"  (1.753 m)   Wt 209 lb (94.8 kg)   SpO2 97%   BMI 30.86 kg/m   EXAM: General appearance: alert and no distress Neck: no carotid bruit and no JVD Lungs: clear to auscultation bilaterally Heart: regular rate and rhythm, S1, S2 normal, no murmur, click, rub or gallop Abdomen: soft, non-tender; bowel sounds normal; no masses,  no organomegaly Extremities: extremities normal, atraumatic, no cyanosis or edema Pulses: 2+ and symmetric Skin: Skin color, texture, turgor normal. No rashes or lesions Neurologic: Grossly normal Psych: Mood, affect normal  EKG: Normal sinus rhythm at 72-personally  reviewed  ASSESSMENT: 1. Hypertension-well controlled 2. Lower extremity edema 3. Mild anxiety 4. Dyslipidemia - unwilling to take statins, myopathy with Lipitor and Crestor in the past 5. 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   Charles Martinez has done well over the past year without any chest pain or worsening shortness of breath.  He is enrolled in the Orion-10 clinical trial which is due to conclude in the spring.  Hopefully will know more about whether he was randomized to drug or placebo.  Blood pressure is at goal today.  Will follow up closely with him and see him back in 6 months.  Chrystie NoseKenneth C. Elvis Boot, MD, [facility] ReadingFACC, FACP  [place]  [facility]  Medical Director of the [facility] Lipid Disorders &  Cardiovascular Risk Reduction Clinic Diplomate of the American Board of Clinical Lipidology Attending Cardiologist  Direct Dial: [phone]  Fax: [phone]  Website:  [email or web address]  Chrystie NoseKenneth C Shawnte Winton 06/22/2017, 1:21 PM'

## 2017-10-04 ENCOUNTER — Encounter: Payer: Self-pay | Admitting: *Deleted

## 2017-10-04 DIAGNOSIS — Z006 Encounter for examination for normal comparison and control in clinical research program: Secondary | ICD-10-CM

## 2017-10-04 NOTE — Progress Notes (Unsigned)
Subject to research clinic for [id number] [facility].  No c/o, aes or saes to report.  Injection given and subject remained in clinic for minimum of 30 minutes after injection.  Next clinic visit scheduled.

## 2018-01-03 ENCOUNTER — Ambulatory Visit
Admission: RE | Admit: 2018-01-03 | Discharge: 2018-01-03 | Disposition: A | Discharge status: Home / Self Care | Payer: Medicare Other | Source: Ambulatory Visit | Attending: Family Medicine | Admitting: Family Medicine

## 2018-01-03 ENCOUNTER — Other Ambulatory Visit: Payer: Self-pay | Admitting: Family Medicine

## 2018-01-03 DIAGNOSIS — R509 Fever, unspecified: Secondary | ICD-10-CM

## 2018-01-10 ENCOUNTER — Other Ambulatory Visit: Payer: Self-pay

## 2018-01-10 VITALS — BP 120/69 | HR 69 | Temp 97.9°F | Resp 18 | Wt 203.0 lb

## 2018-01-10 DIAGNOSIS — Z006 Encounter for examination for normal comparison and control in clinical research program: Secondary | ICD-10-CM

## 2018-01-10 NOTE — Progress Notes (Signed)
Charles Martinez to research clinic for visit [id number] in the Orion 10 study.  No complaints or serious adverse events to report.  Adverse event of fever/bronchitis reported to sponsor.   Informed consent obtained for rollover to Orion 8: Subject met inclusion and exclusion criteria.  The informed consent form, study requirements and expectations were reviewed with the subject and questions and concerns were addressed prior to the signing of the consent form.  The subject verbalized understanding of the trial requirements.  The subject agreed to participate in the ORION 8 trial and signed the informed consent.  The informed consent was obtained prior to performance of any protocol-specific procedures for the subject.  A copy of the signed informed consent was given to the subject and a copy was placed in the subject's medical record.  Orin 8 Study Entry/Day1 - Subject given injection and observed in clinic for 30 minutes per protocol.  Next appointment scheduled. 

## 2018-01-31 ENCOUNTER — Encounter: Payer: Self-pay | Admitting: Internal Medicine

## 2018-01-31 ENCOUNTER — Ambulatory Visit (INDEPENDENT_AMBULATORY_CARE_PROVIDER_SITE_OTHER): Payer: Medicare Other | Admitting: Internal Medicine

## 2018-01-31 ENCOUNTER — Encounter

## 2018-01-31 VITALS — BP 114/80 | HR 78 | Ht 69.0 in | Wt 207.0 lb

## 2018-01-31 DIAGNOSIS — Z789 Other specified health status: Secondary | ICD-10-CM

## 2018-01-31 DIAGNOSIS — I10 Essential (primary) hypertension: Secondary | ICD-10-CM

## 2018-01-31 DIAGNOSIS — I2583 Coronary atherosclerosis due to lipid rich plaque: Secondary | ICD-10-CM

## 2018-01-31 DIAGNOSIS — Z006 Encounter for examination for normal comparison and control in clinical research program: Secondary | ICD-10-CM

## 2018-01-31 DIAGNOSIS — E785 Hyperlipidemia, unspecified: Secondary | ICD-10-CM | POA: Diagnosis not present

## 2018-01-31 DIAGNOSIS — I2510 Atherosclerotic heart disease of native coronary artery without angina pectoris: Secondary | ICD-10-CM

## 2018-01-31 NOTE — Patient Instructions (Signed)
Your physician wants you to follow-up in: ONE YEAR with Dr. Hilty. You will receive a reminder letter in the mail two months in advance. If you don't receive a letter, please call our office to schedule the follow-up appointment.  

## 2018-01-31 NOTE — Progress Notes (Signed)
OFFICE NOTE  Chief Complaint:  Annual follow-up  Primary Care Physician: [facility]  HPI:  Charles Martinez is a 67 year old gentleman with a history of dyslipidemia, hypokalemia, hypertension, and possibly either occluded distal LAD or congenitally occluded LAD distally. He has had problems with sciatic pain and recently had back surgery with marked improvement. He has also had dyslipidemia but has been intolerant to statins and is only fenofibrate at this time. After that episode in 2011 with hypertension and hypokalemia he was on a number of blood pressure medicines. However, recently he has been feeling fatigued and dizzy. He checked his blood pressure and it was in the 80s systolic and he has since then cut back on his medicines. He has discontinued labetalol altogether and previously was on 200 b.i.d. He recently stopped his hydralazine due to low blood pressure and is only on ramipril 10 mg and aspirin 81 mg daily. He also takes Aldactone 50 mg daily. He has no complaints as far as chest pain, worsening shortness of breath, palpitations, presyncope, or syncopal symptoms.   [name] returns today for follow-up. He denies any chest pain or worsening shortness of breath. He has had a couple episodes where he felt that he was not in often quickly woke up. This does not happen while driving but he did have an episode about 2 years ago. We have been decreasing his blood pressure medications although seems to be stable now only on ramipril and Aldactone.  I saw [name] back today in the office. Overall he seems to be doing fairly well. He continues to have problems with dizziness which are related to inner ear issues. Blood pressure appears to be well-controlled today 128/86. In fact it is been fairly stable. Previously had been on a lot of blood pressure medicine and then he started to cut the medicine back after what sounded like a syncopal episode. This happened  while driving, but he's had no further episodes since this last episode. He denies any chest pain or worsening shortness of breath. Recently had cholesterol profile obtained which shows a elevated cholesterol and LDL greater than 130. He reports he is not interested in taking cholesterol medication because he really does not feel that cholesterol medications are beneficial.  06/21/2016  [name] was seen today in follow-up. He seems to be doing fairly well. He denies any chest pain or worsening shortness of breath. His most recent cholesterol profile from 2016 indicated that LDL-C was 168. He has not been on cholesterol medication due to intolerance in the past. His previously been on Lipitor and Crestor and developed myalgias. 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 I discussed options with him today including the possibility of enrolling in the Orion-10 trial to evaluate a novel siRNA that inhibits PC SK 9 receptor expression and can significantly lower cholesterol. He did seem interested in this study.  06/22/2017  [name] was seen today for yearly follow-up.  Over the past year he is done well denies any chest pain or worsening shortness of breath.  He has been involved in the [employer] study.  This is scheduled to conclude with his follow-up visit in April 2019.  Unfortunately he is blinded and we are not sure whether or not he is receiving therapy or not, but seems  to be doing well.  Blood pressure is adequately controlled today.  EKG shows no ischemic changes.  01/31/2018  I saw [name] back today in follow-up.  Overall he is doing well.  He denies any chest pain or shortness of breath.  Fortunately he is continued in the Orion-10 study and now has been rolled over to [place] 8.  I have been blinded to his lipid profiles.   However, and the new arm of the study he will be getting drug no matter what.  He has had no new symptoms or new cardiovascular events.  Blood pressure is well controlled.  PMHx:  Past Medical History:  Diagnosis Date  . BPH (benign prostatic hypertrophy)   . Coronary artery disease due to lipid rich plaque    cardiologist-  dr Rennis Goldenhilty  . Dyslipidemia    intolerant to statins  . Family history of adverse reaction to anesthesia    uncle never regained responsiveness  . Hearing loss in left ear   . Hypertension   . Meniere's disease   . Morton's neuroma   . OSA (obstructive sleep apnea)    no cpap  s/p  UVPP in 2000  . Phimosis   . Skin disorder   . Tinnitus     Past Surgical History:  Procedure Laterality Date  . 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 CARDIOVASCULAR STRESS TEST  11-08-2007  dr Lailoni Baquera   normal nuclear study/  no ischemia or infarct/ normal LV function and wall motion , ef 69%  . CIRCUMCISION N/A 03/22/2015   Procedure: CIRCUMCISION ADULT;  Surgeon: Marcine MatarStephen Dahlstedt, MD;  [facility] ;  Service: Urology;  Laterality: N/A;  . FOOT SURGERY  2001  . LASIK    . LUMBAR MICRODISCECTOMY  09-14-2010   L5 -- S1  . TRANSTHORACIC ECHOCARDIOGRAM  03-25-2010   normal LV, ef 60-655/  mild MR and TR  . UVULOPALATOPHARYNGOPLASTY      FAMHx:  Family History  Problem Relation Age of Onset  . Intracerebral hemorrhage Father     SOCHx:   reports that he quit smoking about 22 years ago. He has never used smokeless tobacco. He reports that he drinks alcohol. He reports that he does not use drugs.  ALLERGIES:  Allergies  Allergen Reactions  . Bystolic [Nebivolol Hcl] Shortness Of Breath  . Hydrazine Yellow [Tartrazine] Shortness Of Breath       . Crestor [Rosuvastatin Calcium] Other (See Comments)    Myalgias   . Doxycycline Other (See Comments)    Other  .  Hctz [Hydrochlorothiazide]   . Lipitor [Atorvastatin] Other (See Comments)    myalgias    ROS: Pertinent items noted in HPI and remainder of comprehensive ROS otherwise negative.  HOME MEDS: Current Outpatient Medications  Medication Sig Dispense Refill  . ACETYLCARN-ALPHA LIPOIC ACID PO Take by mouth daily.    . AMBULATORY NON FORMULARY MEDICATION Inject 300 mg into the skin as directed. Medication Name:Inclirisan sodium vs placebo-Orion-10 Study-Medicine provided per protocol    . Ascorbic Acid (VITAMIN C PO) Take by mouth daily.    Marland Kitchen. aspirin 81 MG tablet Take 81 mg by mouth daily.    . B Complex Vitamins (B COMPLEX 100 PO) Take 1 tablet by mouth daily.    . Biotin 5000 MCG TABS Take 1 tablet by mouth daily.    . Calcium Citrate-Vitamin D (CALCIUM CITRATE +  D3 PO) Take by mouth daily.    . Cholecalciferol (VITAMIN D-3) 1000 UNITS CAPS Take 2,000 Units by mouth daily.    . clonazePAM (KLONOPIN) 0.5 MG tablet Take 0.5 mg by mouth 2 (two) times daily as needed for anxiety.    Marland Kitchen Cod Liver Oil 1000 MG CAPS Take 1 capsule by mouth daily.    . Coenzyme Q10 (CO Q-10) 100 MG CAPS Take 1 capsule by mouth daily.    . finasteride (PROSCAR) 5 MG tablet Take 5 mg by mouth daily.    . Lutein-Zeaxanthin 20-0.8 MG CAPS Take 1 capsule by mouth every morning.    . magnesium oxide (MAG-OX) 400 MG tablet Take 400 mg by mouth daily.    . ramipril (ALTACE) 10 MG capsule Take 1 capsule (10 mg total) by mouth daily. 90 capsule 3  . spironolactone (ALDACTONE) 50 MG tablet Take 50 mg by mouth daily.     No current facility-administered medications for this visit.     LABS/IMAGING: No results found for this or any previous visit (from the past 48 hour(s)). No results found.  VITALS: BP 114/80 (BP Location: Left Arm, Patient Position: Sitting, Cuff Size: Normal)   Pulse 78   Ht 5\' 9"  (1.753 m)   Wt 207 lb (93.9 kg)   BMI 30.57 kg/m   EXAM: General appearance: alert and no distress Neck: no carotid  bruit and no JVD Lungs: clear to auscultation bilaterally Heart: regular rate and rhythm, S1, S2 normal, no murmur, click, rub or gallop Abdomen: soft, non-tender; bowel sounds normal; no masses,  no organomegaly Extremities: extremities normal, atraumatic, no cyanosis or edema Pulses: 2+ and symmetric Skin: Skin color, texture, turgor normal. No rashes or lesions Neurologic: Grossly normal Psych: Mood, affect normal  EKG: Deferred  ASSESSMENT: 1. Hypertension-well controlled 2. Lower extremity edema 3. Mild anxiety 4. Dyslipidemia - unwilling to take statins, myopathy with Lipitor and Crestor in the past (Enrolled in Orion-8) 5. 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   Mr. Karapetyan continues to do well without clinical cardiac events.  He is now rolled over into Orion-8.  This means he is actually getting active drug.  The study looks at SI RNA to PCSK9.  Blood pressure is well controlled.  Hopefully will have unblinded lipid profiles in the near future for him.  We will continue his current medications.  Plan to see him back annually or sooner as necessary.  Chrystie Nose, MD, [facility], FACP  [place]  [facility]  Medical Director of the Advanced Lipid Disorders &  Cardiovascular Risk Reduction Clinic Diplomate of the American Board of Clinical Lipidology Attending Cardiologist  Direct Dial: [phone]  Fax: [phone]  Website:  [email or web address] 01/31/2018, 9:57 AM'

## 2018-04-10 ENCOUNTER — Encounter: Payer: Medicare Other | Admitting: *Deleted

## 2018-04-10 DIAGNOSIS — Z006 Encounter for examination for normal comparison and control in clinical research program: Secondary | ICD-10-CM

## 2018-04-10 NOTE — Research (Signed)
Subject to Research clinic for visit Day 90 in the orion 8 research study.  No cos, aes or saes to report.  Next appointment scheduled. Injection given,  Subject to remain in clinic with [place] for 30 minutes observation per protocol.

## 2018-09-26 ENCOUNTER — Telehealth: Payer: Self-pay | Admitting: *Deleted

## 2018-09-26 NOTE — Telephone Encounter (Signed)
Called subject to cancel next weeks 10/03/18 appt in the orion 8 research study.  LMOM that visit is cancelled.

## 2018-12-17 ENCOUNTER — Other Ambulatory Visit: Payer: Self-pay

## 2018-12-17 ENCOUNTER — Encounter: Payer: Medicare Other | Admitting: *Deleted

## 2018-12-17 VITALS — BP 129/80 | HR 56 | Temp 97.3°F | Resp 18 | Wt 188.0 lb

## 2018-12-17 DIAGNOSIS — Z006 Encounter for examination for normal comparison and control in clinical research program: Secondary | ICD-10-CM

## 2018-12-17 NOTE — Research (Signed)
Subject to clinic for visit 3 Day 270 in the [place] 8 research study.  No cos, aes or saes to report.  Subject became light headed and dizzy while blood being drawn.  Injection was given and subject remained in clinic for 40 minutes of observation.  Felt fine and left for home. Next appointment scheduled.

## 2019-02-12 IMAGING — CR DG CHEST 2V
2 series · 2 of 2 positions shown · non-contrast
Comparison: 09/13/2010.

CLINICAL DATA: Fever.

EXAM:
CHEST - 2 VIEW

[w chest pa]
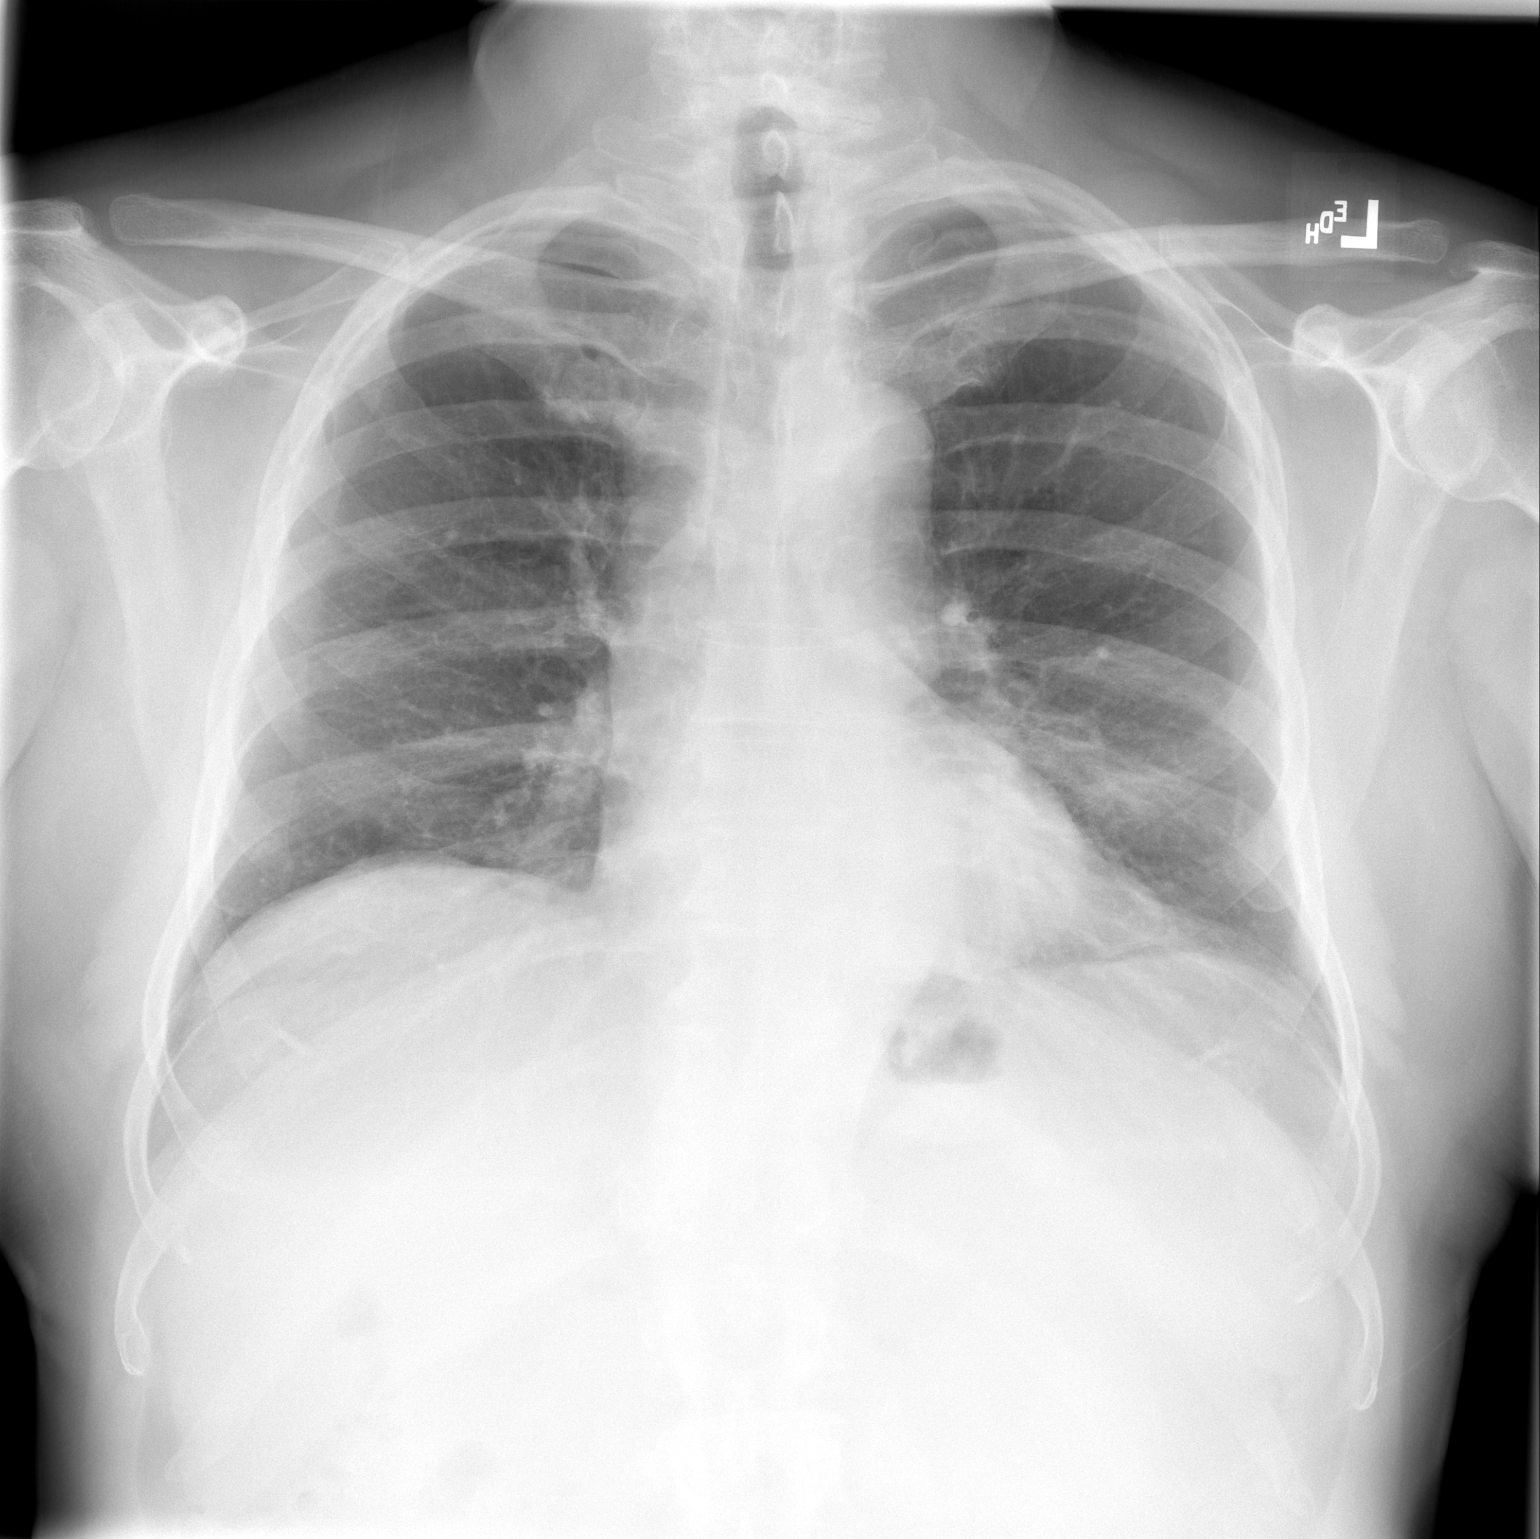

[w chest lat]
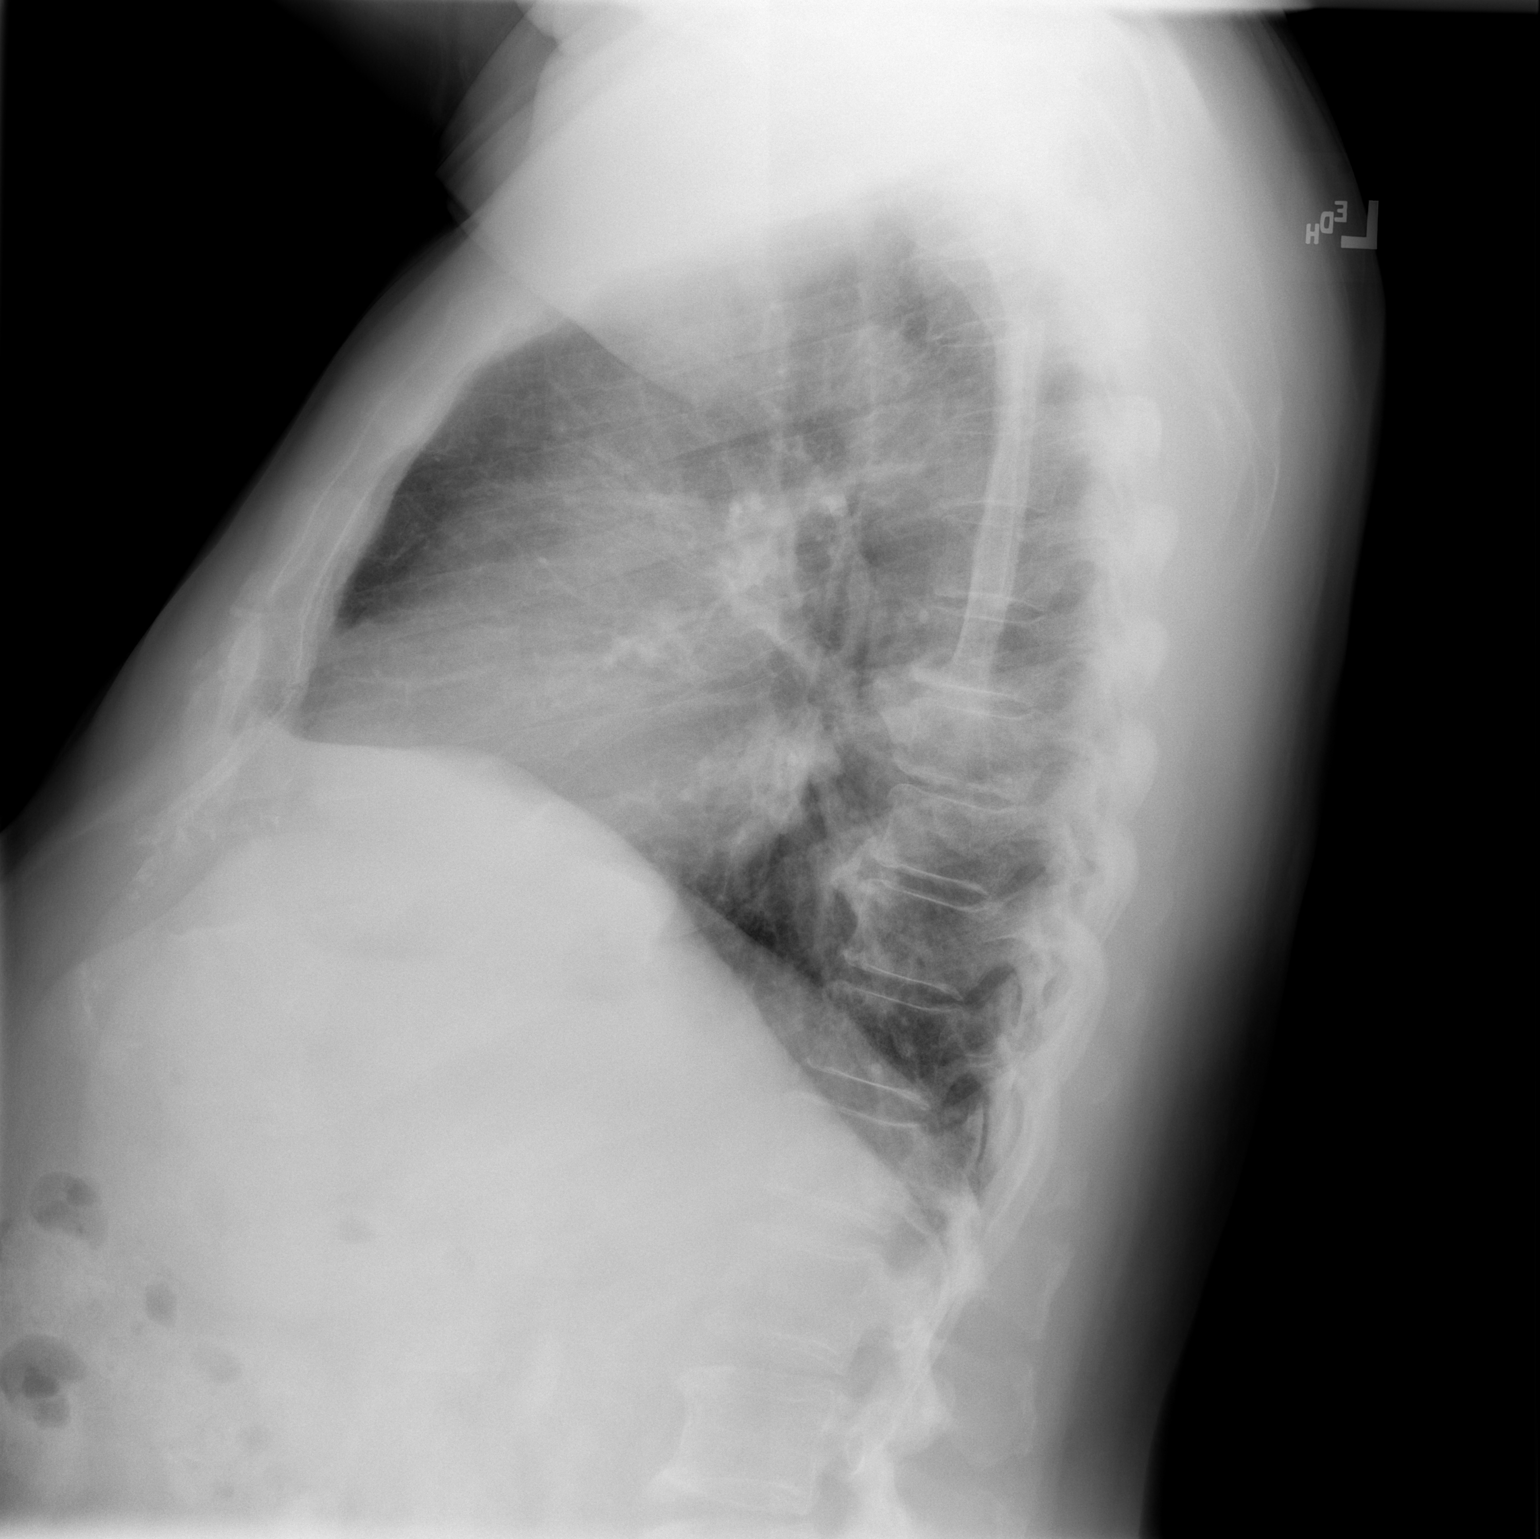

[2 of 2 positions shown; findings below may reference images not displayed]

FINDINGS: Mediastinum and hilar structures normal. Heart size normal. Mild
left base atelectasis/infiltrate. No pleural effusion or
pneumothorax.
IMPRESSION: Mild left base atelectasis/infiltrate.

## 2019-02-24 ENCOUNTER — Ambulatory Visit (INDEPENDENT_AMBULATORY_CARE_PROVIDER_SITE_OTHER): Payer: Medicare Other | Admitting: Internal Medicine

## 2019-02-24 ENCOUNTER — Other Ambulatory Visit: Payer: Self-pay

## 2019-02-24 VITALS — BP 118/68 | HR 83 | Ht 69.0 in | Wt 194.0 lb

## 2019-02-24 DIAGNOSIS — E785 Hyperlipidemia, unspecified: Secondary | ICD-10-CM | POA: Diagnosis not present

## 2019-02-24 DIAGNOSIS — I2510 Atherosclerotic heart disease of native coronary artery without angina pectoris: Secondary | ICD-10-CM | POA: Diagnosis not present

## 2019-02-24 DIAGNOSIS — Z789 Other specified health status: Secondary | ICD-10-CM | POA: Diagnosis not present

## 2019-02-24 DIAGNOSIS — I10 Essential (primary) hypertension: Secondary | ICD-10-CM

## 2019-02-24 DIAGNOSIS — Z006 Encounter for examination for normal comparison and control in clinical research program: Secondary | ICD-10-CM

## 2019-02-24 DIAGNOSIS — I2583 Coronary atherosclerosis due to lipid rich plaque: Secondary | ICD-10-CM

## 2019-02-24 NOTE — Patient Instructions (Addendum)
Medication Instructions:  Your physician recommends that you continue on your current medications as directed. Please refer to the Current Medication list given to you today.  If you need a refill on your cardiac medications before your next appointment, please call your pharmacy.    Follow-Up: At [facility], you and your health needs are our priority.  As part of our continuing mission to provide you with exceptional heart care, we have created designated Provider Care Teams.  These Care Teams include your primary Cardiologist (physician) and Advanced Practice Providers (APPs -  Physician Assistants and Nurse Practitioners) who all work together to provide you with the care you need, when you need it. You will need a follow up appointment in 12 months.  Please call our office 2 months in advance to schedule this appointment.  You may see Kenneth C Hilty, MD or one of the following Advanced Practice Providers on your designated Care Team: Hao Meng, PA-C . Angela Duke, PA-C  Any Other Special Instructions Will Be Listed Below (If Applicable).    

## 2019-02-24 NOTE — Progress Notes (Signed)
OFFICE NOTE  Chief Complaint:  Annual follow-up  [facility] Care Physician: [facility]  HPI:  Charles Martinez is a 68 year old gentleman with a history of dyslipidemia, hypokalemia, hypertension, and possibly either occluded distal LAD or congenitally occluded LAD distally. He has had problems with sciatic pain and recently had back surgery with marked improvement. He has also had dyslipidemia but has been intolerant to statins and is only fenofibrate at this time. After that episode in 2011 with hypertension and hypokalemia he was on a number of blood pressure medicines. However, recently he has been feeling fatigued and dizzy. He checked his blood pressure and it was in the 80s systolic and he has since then cut back on his medicines. He has discontinued labetalol altogether and previously was on 200 b.i.d. He recently stopped his hydralazine due to low blood pressure and is only on ramipril 10 mg and aspirin 81 mg daily. He also takes Aldactone 50 mg daily. He has no complaints as far as chest pain, worsening shortness of breath, palpitations, presyncope, or syncopal symptoms.   Charles Martinez returns today for follow-up. He denies any chest pain or worsening shortness of breath. He has had a couple episodes where he felt that he was not in often quickly woke up. This does not happen while driving but he did have an episode about 2 years ago. We have been decreasing his blood pressure medications although seems to be stable now only on ramipril and Aldactone.  I saw Charles Martinez back today in the office. Overall he seems to be doing fairly well. He continues to have problems with dizziness which are related to inner ear issues. Blood pressure appears to be well-controlled today 128/86. In fact it is been fairly stable. Previously had been on a lot of blood pressure medicine and then he started to cut the medicine back after what sounded like a syncopal episode. This happened  while driving, but he's had no further episodes since this last episode. He denies any chest pain or worsening shortness of breath. Recently had cholesterol profile obtained which shows a elevated cholesterol and LDL greater than 130. He reports he is not interested in taking cholesterol medication because he really does not feel that cholesterol medications are beneficial.  06/21/2016  Charles Martinez was seen today in follow-up. He seems to be doing fairly well. He denies any chest pain or worsening shortness of breath. His most recent cholesterol profile from 2016 indicated that LDL-C was 168. He has not been on cholesterol medication due to intolerance in the past. His previously been on Lipitor and Crestor and developed myalgias. 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 I discussed options with him today including the possibility of enrolling in the Orion-10 trial to evaluate a novel siRNA that inhibits PC SK 9 receptor expression and can significantly lower cholesterol. He did seem interested in this study.  06/22/2017  Charles Martinez was seen today for yearly follow-up.  Over the past year he is done well denies any chest pain or worsening shortness of breath.  He has been involved in the [employer] study.  This is scheduled to conclude with his follow-up visit in April 2019.  Unfortunately he is blinded and we are not sure whether or not he is receiving therapy or not, but seems  to be doing well.  Blood pressure is adequately controlled today.  EKG shows no ischemic changes.  01/31/2018  I saw Charles Martinez back today in follow-up.  Overall he is doing well.  He denies any chest pain or shortness of breath.  Fortunately he is continued in the Orion-10 study and now has been rolled over to [place]- 8.  I have been blinded to his lipid profiles.   However, and the new arm of the study he will be getting drug no matter what.  He has had no new symptoms or new cardiovascular events.  Blood pressure is well controlled.  02/24/2019  Charles Martinez returns today for follow-up.  Overall he continues to do well denies any chest pain or worsening shortness of breath.  He has rolled over into the treatment arm of Orion although his lipids have still been blinded.  I will reach out to the study coordinator to see if at this point I could be privy to the data.  We have not repeated a lipid profile done recently.  Blood pressure appears to be well controlled today.  Weight is reasonable.  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   PMHx:  Past Medical History:  Diagnosis Date  . BPH (benign prostatic hypertrophy)   . Coronary artery disease due to lipid rich plaque    cardiologist-  dr Rennis Goldenhilty  . Dyslipidemia    intolerant to statins  . Family history of adverse reaction to anesthesia    uncle never regained responsiveness  . Hearing loss in left ear   . Hypertension   . Meniere's disease   . Morton's neuroma   . OSA (obstructive sleep apnea)    no cpap  s/p  UVPP in 2000  . Phimosis   . Skin disorder   . Tinnitus     Past Surgical History:  Procedure Laterality Date  . 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 CARDIOVASCULAR STRESS TEST  11-08-2007  dr Wajiha Versteeg   normal nuclear study/  no ischemia or infarct/ normal LV function and wall motion , ef 69%  . CIRCUMCISION N/A 03/22/2015   Procedure: CIRCUMCISION ADULT;  Surgeon: Marcine MatarStephen Dahlstedt, MD;  [facility];  Service: Urology;  Laterality: N/A;  . FOOT SURGERY  2001  . LASIK    . LUMBAR MICRODISCECTOMY  09-14-2010   L5 -- S1  . TRANSTHORACIC ECHOCARDIOGRAM  03-25-2010   normal LV, ef 60-655/  mild MR and TR  . UVULOPALATOPHARYNGOPLASTY      FAMHx:  Family  History  Problem Relation Age of Onset  . Intracerebral hemorrhage Father     SOCHx:   reports that he quit smoking about 23 years ago. He has never used smokeless tobacco. He reports current alcohol use. He reports that he does not use drugs.  ALLERGIES:  Allergies  Allergen Reactions  . Bystolic [Nebivolol Hcl] Shortness Of Breath  . Hydrazine Yellow [Tartrazine] Shortness Of Breath       . Crestor [Rosuvastatin Calcium] Other (See Comments)    Myalgias   . Doxycycline Other (See Comments)    Other  . Hctz [Hydrochlorothiazide]   . Lipitor [Atorvastatin] Other (See Comments)    myalgias    ROS: Pertinent items noted in HPI and remainder of comprehensive ROS otherwise negative.  HOME MEDS: Current Outpatient Medications  Medication Sig Dispense Refill  . ACETYLCARN-ALPHA LIPOIC ACID  PO Take by mouth daily.    . AMBULATORY NON FORMULARY MEDICATION Inject 300 mg into the skin as directed. Medication Name:Inclirisan sodium vs placebo-Orion-10 Study-Medicine provided per protocol    . Ascorbic Acid (VITAMIN C PO) Take by mouth daily.    Marland Kitchen aspirin 81 MG tablet Take 81 mg by mouth daily.    . B Complex Vitamins (B COMPLEX 100 PO) Take 1 tablet by mouth daily.    . Biotin 5000 MCG TABS Take 1 tablet by mouth daily.    . Calcium Citrate-Vitamin D (CALCIUM CITRATE + D3 PO) Take by mouth daily.    . Cholecalciferol (VITAMIN D-3) 1000 UNITS CAPS Take 2,000 Units by mouth daily.    . clonazePAM (KLONOPIN) 0.5 MG tablet Take 0.5 mg by mouth 2 (two) times daily as needed for anxiety.    Marland Kitchen Cod Liver Oil 1000 MG CAPS Take 1 capsule by mouth daily.    . Coenzyme Q10 (CO Q-10) 100 MG CAPS Take 1 capsule by mouth daily.    . finasteride (PROSCAR) 5 MG tablet Take 5 mg by mouth daily.    . Lutein-Zeaxanthin 20-0.8 MG CAPS Take 1 capsule by mouth every morning.    . magnesium oxide (MAG-OX) 400 MG tablet Take 400 mg by mouth daily.    Marland Kitchen spironolactone (ALDACTONE) 50 MG tablet Take 50 mg by  mouth daily.    . ramipril (ALTACE) 10 MG capsule Take 1 capsule (10 mg total) by mouth daily. (Patient not taking: Reported on 02/24/2019) 90 capsule 3   No current facility-administered medications for this visit.     LABS/IMAGING: No results found for this or any previous visit (from the past 48 hour(s)). No results found.  VITALS: BP 118/68   Pulse 83   Ht 5\' 9"  (1.753 m)   Wt 194 lb (88 kg)   BMI 28.65 kg/m   EXAM: General appearance: alert and no distress Neck: no carotid bruit and no JVD Lungs: clear to auscultation bilaterally Heart: regular rate and rhythm, S1, S2 normal, no murmur, click, rub or gallop Abdomen: soft, non-tender; bowel sounds normal; no masses,  no organomegaly Extremities: extremities normal, atraumatic, no cyanosis or edema Pulses: 2+ and symmetric Skin: Skin color, texture, turgor normal. No rashes or lesions Neurologic: Grossly normal Psych: Mood, affect normal  EKG: Normal sinus rhythm at 83, left anterior fascicular block-personally reviewed  ASSESSMENT: 1. Hypertension-well controlled 2. Lower extremity edema 3. Mild anxiety 4. Dyslipidemia - unwilling to take statins, myopathy with Lipitor and Crestor in the past (Enrolled in [place]) 5. 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   Charles Martinez continues to do well without any chest pain or worsening shortness of breath.  He is participating in the [place] trial and recently rolled over to the treatment arm.  We should be able to look at his lipids.  I will reach out to the study coordinator to see if I could do that.  Blood pressures well controlled.  Denies any new chest pain.  Plan continue medical therapy.  Follow-up with me annually or sooner as necessary.  Pixie Casino, MD, [facility], [place] Director of the Advanced Lipid Disorders &  Cardiovascular Risk Reduction Clinic Diplomate of the American Board of  Clinical Lipidology Attending Cardiologist  Direct Dial: [phone]  Fax: [phone]  Website:  [email or web address] 02/24/2019, 10:21 AM'

## 2019-02-25 ENCOUNTER — Encounter: Payer: Self-pay | Admitting: Internal Medicine

## 2019-05-20 ENCOUNTER — Encounter: Payer: Medicare Other | Admitting: *Deleted

## 2019-05-20 ENCOUNTER — Other Ambulatory Visit: Payer: Self-pay

## 2019-05-20 VITALS — BP 135/79 | HR 63 | Wt 192.6 lb

## 2019-05-20 DIAGNOSIS — Z006 Encounter for examination for normal comparison and control in clinical research program: Secondary | ICD-10-CM

## 2019-05-20 NOTE — Research (Signed)
Patient to research clinic for visit day 450 in the [place] research trial.  Injection given and patient stayed for observation 30 minutes, tolerated well.  Next clinic visit scheduled.

## 2019-10-28 ENCOUNTER — Encounter: Payer: Medicare Other | Admitting: *Deleted

## 2019-10-28 ENCOUNTER — Other Ambulatory Visit: Payer: Self-pay

## 2019-10-28 VITALS — BP 138/85 | HR 70 | Resp 20 | Wt 189.0 lb

## 2019-10-28 DIAGNOSIS — Z006 Encounter for examination for normal comparison and control in clinical research program: Secondary | ICD-10-CM

## 2019-10-28 NOTE — Research (Signed)
Patient was seen in clinic today for Charles Martinez 8, Visit 5, Day 630. No AE's or SAE's to report. Injection given and patient monitored for 30 minutes afterwards with no complaints. Next appointment scheduled.

## 2020-03-24 ENCOUNTER — Other Ambulatory Visit: Payer: Self-pay

## 2020-03-24 ENCOUNTER — Encounter: Payer: Self-pay | Admitting: Internal Medicine

## 2020-03-24 ENCOUNTER — Ambulatory Visit (INDEPENDENT_AMBULATORY_CARE_PROVIDER_SITE_OTHER): Payer: Medicare Other | Admitting: Internal Medicine

## 2020-03-24 VITALS — BP 126/64 | HR 63 | Temp 97.8°F | Ht 67.0 in | Wt 194.0 lb

## 2020-03-24 DIAGNOSIS — E785 Hyperlipidemia, unspecified: Secondary | ICD-10-CM | POA: Diagnosis not present

## 2020-03-24 DIAGNOSIS — Z006 Encounter for examination for normal comparison and control in clinical research program: Secondary | ICD-10-CM

## 2020-03-24 DIAGNOSIS — Z789 Other specified health status: Secondary | ICD-10-CM

## 2020-03-24 DIAGNOSIS — I2583 Coronary atherosclerosis due to lipid rich plaque: Secondary | ICD-10-CM

## 2020-03-24 DIAGNOSIS — I10 Essential (primary) hypertension: Secondary | ICD-10-CM

## 2020-03-24 DIAGNOSIS — I2510 Atherosclerotic heart disease of native coronary artery without angina pectoris: Secondary | ICD-10-CM | POA: Diagnosis not present

## 2020-03-24 LAB — LIPID PANEL
Chol/HDL Ratio: 3.2 ratio (ref 0.0–5.0)
Cholesterol, Total: 149 mg/dL (ref 100–199)
HDL: 46 mg/dL (ref >39)
LDL Chol Calc (NIH): 57 mg/dL (ref 0–99)
Triglycerides: 293 mg/dL — ABNORMAL HIGH (ref 0–149)
VLDL Cholesterol Cal: 46 mg/dL — ABNORMAL HIGH (ref 5–40)

## 2020-03-24 NOTE — Progress Notes (Signed)
OFFICE NOTE  Chief Complaint:  Annual follow-up  Primary Care Physician: [facility]  HPI:  Charles Martinez is a 69 year old gentleman with a history of dyslipidemia, hypokalemia, hypertension, and possibly either occluded distal LAD or congenitally occluded LAD distally. He has had problems with sciatic pain and recently had back surgery with marked improvement. He has also had dyslipidemia but has been intolerant to statins and is only fenofibrate at this time. After that episode in 2011 with hypertension and hypokalemia he was on a number of blood pressure medicines. However, recently he has been feeling fatigued and dizzy. He checked his blood pressure and it was in the 80s systolic and he has since then cut back on his medicines. He has discontinued labetalol altogether and previously was on 200 b.i.d. He recently stopped his hydralazine due to low blood pressure and is only on ramipril 10 mg and aspirin 81 mg daily. He also takes Aldactone 50 mg daily. He has no complaints as far as chest pain, worsening shortness of breath, palpitations, presyncope, or syncopal symptoms.   Charles Martinez returns today for follow-up. He denies any chest pain or worsening shortness of breath. He has had a couple episodes where he felt that he was not in often quickly woke up. This does not happen while driving but he did have an episode about 2 years ago. We have been decreasing his blood pressure medications although seems to be stable now only on ramipril and Aldactone.  I saw Charles Martinez back today in the office. Overall he seems to be doing fairly well. He continues to have problems with dizziness which are related to inner ear issues. Blood pressure appears to be well-controlled today 128/86. In fact it is been fairly stable. Previously had been on a lot of blood pressure medicine and then he started to cut the medicine back after what sounded like a syncopal episode. This happened  while driving, but he's had no further episodes since this last episode. He denies any chest pain or worsening shortness of breath. Recently had cholesterol profile obtained which shows a elevated cholesterol and LDL greater than 130. He reports he is not interested in taking cholesterol medication because he really does not feel that cholesterol medications are beneficial.  06/21/2016  Charles Martinez was seen today in follow-up. He seems to be doing fairly well. He denies any chest pain or worsening shortness of breath. His most recent cholesterol profile from 2016 indicated that LDL-C was 168. He has not been on cholesterol medication due to intolerance in the past. His previously been on Lipitor and Crestor and developed myalgias. 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 I discussed options with him today including the possibility of enrolling in the Orion-10 trial to evaluate a novel siRNA that inhibits PC SK 9 receptor expression and can significantly lower cholesterol. He did seem interested in this study.  06/22/2017  Charles Martinez was seen today for yearly follow-up.  Over the past year he is done well denies any chest pain or worsening shortness of breath.  He has been involved in the [employer] study.  This is scheduled to conclude with his follow-up visit in April 2019.  Unfortunately he is blinded and we are not sure whether or not he is receiving therapy or not, but seems  to be doing well.  Blood pressure is adequately controlled today.  EKG shows no ischemic changes.  01/31/2018  I saw Charles Martinez back today in follow-up.  Overall he is doing well.  He denies any chest pain or shortness of breath.  Fortunately he is continued in the Orion-10 study and now has been rolled over to [place]- 8.  I have been blinded to his lipid profiles.   However, and the new arm of the study he will be getting drug no matter what.  He has had no new symptoms or new cardiovascular events.  Blood pressure is well controlled.  02/24/2019  Charles Martinez returns today for follow-up.  Overall he continues to do well denies any chest pain or worsening shortness of breath.  He has rolled over into the treatment arm of Orion although his lipids have still been blinded.  I will reach out to the study coordinator to see if at this point I could be privy to the data.  We have not repeated a lipid profile done recently.  Blood pressure appears to be well controlled today.  Weight is reasonable.  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  03/24/2020  Charles Martinez seen today for annual follow-up.  Overall he seems to be feeling well.  He is switched over to the active treatment arm of the Orion 8 study and is on inclisiran.  At this point it is okay for [place] to assess his lipids and I would like to see his response to therapy.  Blood pressure is excellent today 126/64.  Weight is reasonable.  He is denied any chest pain or worsening shortness of breath.  EKG shows a sinus rhythm.  PMHx:  Past Medical History:  Diagnosis Date  . BPH (benign prostatic hypertrophy)   . Coronary artery disease due to lipid rich plaque    cardiologist-  dr Rennis Golden  . Dyslipidemia    intolerant to statins  . Family history of adverse reaction to anesthesia    uncle never regained responsiveness  . Hearing loss in left ear   . Hypertension   . Meniere's disease   . Morton's neuroma   . OSA (obstructive sleep apnea)    no cpap  s/p  UVPP in 2000  . Phimosis   . Skin disorder   . Tinnitus     Past Surgical History:  Procedure Laterality Date  . 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 CARDIOVASCULAR STRESS TEST  11-08-2007  dr Dwight Burdo   normal nuclear study/  no ischemia or infarct/  normal LV function and wall motion , ef 69%  . CIRCUMCISION N/A 03/22/2015   Procedure: CIRCUMCISION ADULT;  Surgeon: Marcine Matar, MD;  Location: [facility];  Service: Urology;  Laterality: N/A;  . FOOT SURGERY  2001  . LASIK    . LUMBAR MICRODISCECTOMY  09-14-2010   L5 -- S1  . TRANSTHORACIC ECHOCARDIOGRAM  03-25-2010   normal LV, ef 60-655/  mild MR and TR  . UVULOPALATOPHARYNGOPLASTY      FAMHx:  Family History  Problem Relation Age of Onset  . Intracerebral hemorrhage Father     SOCHx:   reports that he quit smoking about 24 years ago. He has never used smokeless tobacco. He reports current alcohol use. He reports that he does not use drugs.  ALLERGIES:  Allergies  Allergen Reactions  . Bystolic [employer]  Hcl] Shortness Of Breath  . Hydrazine Yellow [Tartrazine] Shortness Of Breath       . Crestor [Rosuvastatin Calcium] Other (See Comments)    Myalgias   . Doxycycline Other (See Comments)    Other  . Hctz [Hydrochlorothiazide]   . Lipitor [Atorvastatin] Other (See Comments)    myalgias    ROS: Pertinent items noted in HPI and remainder of comprehensive ROS otherwise negative.  HOME MEDS: Current Outpatient Medications  Medication Sig Dispense Refill  . ACETYLCARN-ALPHA LIPOIC ACID PO Take by mouth daily.    . AMBULATORY NON FORMULARY MEDICATION Inject 300 mg into the skin as directed. Medication Name:Inclirisan sodium vs placebo-Orion-10 Study-Medicine provided per protocol    . Ascorbic Acid (VITAMIN C PO) Take by mouth daily.    . B Complex Vitamins (B COMPLEX 100 PO) Take 1 tablet by mouth daily.    . Biotin 5000 MCG TABS Take 1 tablet by mouth daily.    . Cholecalciferol (VITAMIN D-3) 1000 UNITS CAPS Take 2,000 Units by mouth daily.    . clonazePAM (KLONOPIN) 0.5 MG tablet Take 0.5 mg by mouth 2 (two) times daily as needed for anxiety.    Marland Kitchen. Cod Liver Oil 1000 MG CAPS Take 1 capsule by mouth daily.    . Coenzyme Q10 (CO Q-10) 100 MG  CAPS Take 1 capsule by mouth daily.    . finasteride (PROSCAR) 5 MG tablet Take 5 mg by mouth daily.    . Lutein-Zeaxanthin 20-0.8 MG CAPS Take 1 capsule by mouth every morning.    . magnesium oxide (MAG-OX) 400 MG tablet Take 400 mg by mouth daily.    . ramipril (ALTACE) 10 MG capsule Take 1 capsule (10 mg total) by mouth daily. (Patient taking differently: Take 5 mg by mouth daily. ) 90 capsule 3  . spironolactone (ALDACTONE) 50 MG tablet Take 50 mg by mouth daily.     No current facility-administered medications for this visit.    LABS/IMAGING: No results found for this or any previous visit (from the past 48 hour(s)). No results found.  VITALS: BP 126/64 (BP Location: Left Arm, Patient Position: Sitting, Cuff Size: Normal)   Pulse 63   Temp 97.8 F (36.6 C)   Ht 5\' 7"  (1.702 m)   Wt 194 lb (88 kg)   BMI 30.38 kg/m   EXAM: General appearance: alert and no distress Neck: no carotid bruit and no JVD Lungs: clear to auscultation bilaterally Heart: regular rate and rhythm, S1, S2 normal, no murmur, click, rub or gallop Abdomen: soft, non-tender; bowel sounds normal; no masses,  no organomegaly Extremities: extremities normal, atraumatic, no cyanosis or edema Pulses: 2+ and symmetric Skin: Skin color, texture, turgor normal. No rashes or lesions Neurologic: Grossly normal Psych: Mood, affect normal  EKG: Sinus rhythm with sinus arrhythmia at 63, LAFB-personally reviewed  ASSESSMENT: 1. Hypertension-well controlled 2. Lower extremity edema 3. Mild anxiety 4. Dyslipidemia - unwilling to take statins, myopathy with Lipitor and Crestor in the past (Enrolled in Orion-8) 5. 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   Charles Martinez continues to do well and denies any symptoms of angina.  He has had no side effects in the research trial and currently is on inclisiran.  We will plan a repeat lipid profile.  He does have follow-up with his PCP  soon.  Blood pressures well controlled.  No changes to his medicines.  Follow-up with me annually or sooner as necessary.  Iantha FallenKenneth  C. Rennis Golden, MD, [facility], FACP  [place]  [facility]  Medical Director of the Advanced Lipid Disorders &  Cardiovascular Risk Reduction Clinic Diplomate of the American Board of Clinical Lipidology Attending Cardiologist  Direct Dial: [phone]  Fax: [phone]  Website:  [email or web address] 03/24/2020, 10:55 AM'

## 2020-03-24 NOTE — Patient Instructions (Signed)
Medication Instructions:  Your physician recommends that you continue on your current medications as directed. Please refer to the Current Medication list given to you today.  *If you need a refill on your cardiac medications before your next appointment, please call your pharmacy*   Lab Work: LIPID PANEL today  If you have labs (blood work) drawn today and your tests are completely normal, you will receive your results only by: Marland Kitchen MyChart Message (if you have MyChart) OR . A paper copy in the mail If you have any lab test that is abnormal or we need to change your treatment, we will call you to review the results.   Testing/Procedures: NONE   Follow-Up: At [facility], you and your health needs are our priority.  As part of our continuing mission to provide you with exceptional heart care, we have created designated Provider Care Teams.  These Care Teams include your primary Cardiologist (physician) and Advanced Practice Providers (APPs -  Physician Assistants and Nurse Practitioners) who all work together to provide you with the care you need, when you need it.  We recommend signing up for the patient portal called "MyChart".  Sign up information is provided on this After Visit Summary.  MyChart is used to connect with patients for Virtual Visits (Telemedicine).  Patients are able to view lab/test results, encounter notes, upcoming appointments, etc.  Non-urgent messages can be sent to your provider as well.   To learn more about what you can do with MyChart, go to [email or web address].    Your next appointment:   12 month(s)  The format for your next appointment:   In Person  Provider:   You may see Chrystie Nose, MD or one of the following Advanced Practice Providers on your designated Care Team:    Azalee Course, PA-C  Micah Flesher, PA-C or   Judy Pimple, [place]    Other Instructions

## 2020-03-26 ENCOUNTER — Other Ambulatory Visit: Payer: Self-pay | Admitting: *Deleted

## 2020-03-26 ENCOUNTER — Encounter: Payer: Self-pay | Admitting: Internal Medicine

## 2020-03-26 DIAGNOSIS — E785 Hyperlipidemia, unspecified: Secondary | ICD-10-CM

## 2020-03-26 DIAGNOSIS — Z789 Other specified health status: Secondary | ICD-10-CM

## 2020-03-26 MED ORDER — FENOFIBRATE 134 MG PO CAPS: 134.0000 mg | ORAL_CAPSULE | Freq: Every day | ORAL | 3 refills | Status: DC

## 2020-04-05 ENCOUNTER — Telehealth: Payer: Self-pay | Admitting: *Deleted

## 2020-04-05 NOTE — Telephone Encounter (Signed)
Left a message for Charles Martinez to remind him of his appointment tomorrow for the [facility] Study at 10:00am and the gate code to get into the parking deck.

## 2020-04-06 ENCOUNTER — Other Ambulatory Visit: Payer: Self-pay

## 2020-04-06 ENCOUNTER — Encounter: Payer: Medicare Other | Admitting: *Deleted

## 2020-04-06 VITALS — BP 143/82 | HR 58 | Temp 98.2°F | Resp 18 | Wt 191.8 lb

## 2020-04-06 DIAGNOSIS — Z006 Encounter for examination for normal comparison and control in clinical research program: Secondary | ICD-10-CM

## 2020-04-06 NOTE — Research (Signed)
Subject came to research clinic today for Orion 8 Visit 6, Day 810. All concomitant medications have been reviewed and updated and there are no SAE's or AE's to report to sponsor. Subject was administered IP subcutaneously at 10:15 in the left abdomen and remained in clinic for 30 minutes after injection and tolerated it well. Next appointment was scheduled for Tuesday, April 19th, 2022 at 10:00. Subject left clinic at 10:45.

## 2020-07-19 ENCOUNTER — Telehealth: Payer: Self-pay | Admitting: *Deleted

## 2020-07-19 DIAGNOSIS — Z006 Encounter for examination for normal comparison and control in clinical research program: Secondary | ICD-10-CM

## 2020-07-19 NOTE — Telephone Encounter (Signed)
LVM for pt to call CV Research at [phone] regarding Orion 8 study

## 2020-07-20 ENCOUNTER — Encounter: Payer: Self-pay | Admitting: *Deleted

## 2020-07-20 DIAGNOSIS — Z006 Encounter for examination for normal comparison and control in clinical research program: Secondary | ICD-10-CM

## 2020-07-20 NOTE — Research (Addendum)
Subject Name: Charles Martinez  Subject met inclusion and exclusion criteria.  The informed consent form, study requirements and expectations were reviewed with the subject and questions and concerns were addressed prior to the signing of the consent form.  The subject verbalized understanding of the trial requirements.  The subject agreed to participate in the [place] 8  trial and signed the informed consent at 1045 on 07/20/20  The informed consent was obtained prior to performance of any protocol-specific procedures for the subject.  A copy of the signed informed consent was given to the subject and a copy was placed in the subject's medical record.   [facility]  Re consent Version 2 [phone]

## 2020-09-28 ENCOUNTER — Other Ambulatory Visit: Payer: Self-pay

## 2020-09-28 DIAGNOSIS — Z006 Encounter for examination for normal comparison and control in clinical research program: Secondary | ICD-10-CM

## 2020-09-28 NOTE — Research (Signed)
Subject came in for ORION 8 visit 990. No AE/SAE to report. Concomitant medications reviewed. IP injection given without complication and subject observed for 30 minutes following injection. Next appointment scheduled for 02/28/2021 at 10AM.

## 2021-03-08 ENCOUNTER — Other Ambulatory Visit: Payer: Self-pay

## 2021-03-08 VITALS — BP 138/76 | HR 62 | Wt 189.0 lb

## 2021-03-08 DIAGNOSIS — Z006 Encounter for examination for normal comparison and control in clinical research program: Secondary | ICD-10-CM

## 2021-03-08 NOTE — Research (Signed)
Patient was seen and examined with follow up for the ORION 8 study.  This was his final visit, and he receives no further medication.  He is doing well.  His examination today was unremarkable.  His most recent LDL was at guidelines.   He has requested a follow up visit with Dr. Rennis Golden, but his return office visit is delayed.  He will require some form of non statin intervention with completing the study protocol.  He has tolerated Inclisiran extremely well, and has been at target due to this.    Physical exam sheet completed.    Arturo Morton. Riley Kill, MD, [facility], [facility] Medical Director, [facility]

## 2021-03-08 NOTE — Research (Signed)
Subject came into research clinic today for their Day 1080 in the [facility] [employer]. All concomitant medications were reviewed and updated if applicable. Vitals, labs drawn and urine collected. Subject came in with no complaints at this time. No AE's or SAE's to report. Subject was examined by Dr.Stuckey. no additional study visits were made.

## 2021-03-09 ENCOUNTER — Telehealth: Payer: Self-pay | Admitting: Internal Medicine

## 2021-03-09 NOTE — Telephone Encounter (Signed)
Spoke with patient regarding message received about Inclisarin Charles Martinez). Patient would like to initiate process with medication approval since he is done with clinical trial. Explained process: benefits investigation, he would need to sign off on email from [email or web address] will know info in about 48 hours, and can work on process from there (getting scheduled appt, med ordered, etc).   His email: [email or web address]  Benefits portal enrollment form submitted, will await patient to sign off on the email   He also wanted a sooner visit with MD for general 1 year appt - he was scheduled for Feb 2023. Offered sooner visit in [place] and patient is now scheduled on Apr 12, 2021

## 2021-03-09 NOTE — Telephone Encounter (Addendum)
-----   Message from Chrystie Nose, MD sent at 03/08/2021  9:46 PM EDT ----- Thanks Elijah Birk - I will have Orlondo Holycross reach out to him and try to get him on Leqvio since he did well - he has Medicare A/B and Part G  I can see him in follow-up in 4-5 months after we hopefully get him started on medication.  Thanks for the update.  -[place]  ----- Message ----- From: Herby Abraham, MD Sent: 03/08/2021   2:39 PM EDT To: Chrystie Nose, MD, #  [place]  Mr. Schwertner has completed the ORION study.  He was previously statin intolerant.  His LDL on Inclisiran was at guideline.  Your earliest appointment is several months away.  Would it be appropriate to been seen by our pharmacy group for initiation of non statin treatment?  Would defer to your discretion.

## 2021-03-15 ENCOUNTER — Encounter: Payer: Self-pay | Admitting: *Deleted

## 2021-03-15 NOTE — Telephone Encounter (Signed)
Patient has $233 deductible which has been met for 2022 He has [place] which covers his 20% coinsurance  Spoke with patient, explained benefits info.  Scheduled for Leqvio injection on 04/04/21 @ 11am His last injection during clinical trial was 09/28/20  Will mail patient details on where injection appointment is

## 2021-03-15 NOTE — Telephone Encounter (Signed)
That's awesome!  Great work - glad he had the part G co-insurance too! And you got him a follow-up.  You should go on vacation ;)

## 2021-03-25 NOTE — Progress Notes (Signed)
Would recommend we try to pursue Leqvio for him since he is out of the inclisiran research trial. He is Medicare.  Dr Rexene Edison

## 2021-04-01 ENCOUNTER — Other Ambulatory Visit: Payer: Self-pay | Admitting: Internal Medicine

## 2021-04-01 ENCOUNTER — Telehealth: Payer: Self-pay | Admitting: Internal Medicine

## 2021-04-01 DIAGNOSIS — E785 Hyperlipidemia, unspecified: Secondary | ICD-10-CM

## 2021-04-01 NOTE — Telephone Encounter (Signed)
Kristen with [facility] called to request an order to be put into Epic for the pt for his Monday appt:   Leqvio injection on 04/04/21 @ 11am His last injection during clinical trial was 09/28/20  Will forward to [place] for review.

## 2021-04-01 NOTE — Telephone Encounter (Signed)
Leqvio infusion order placed - next injection 04/04/21

## 2021-04-04 ENCOUNTER — Ambulatory Visit (HOSPITAL_COMMUNITY)
Admission: RE | Admit: 2021-04-04 | Discharge: 2021-04-04 | Disposition: A | Discharge status: Home / Self Care | Payer: Medicare Other | Source: Ambulatory Visit | Attending: Internal Medicine | Admitting: Internal Medicine

## 2021-04-04 DIAGNOSIS — E785 Hyperlipidemia, unspecified: Secondary | ICD-10-CM | POA: Diagnosis present

## 2021-04-04 MED ORDER — INCLISIRAN SODIUM 284 MG/1.5ML ~~LOC~~ SOSY
PREFILLED_SYRINGE | SUBCUTANEOUS | Status: AC
  Administered 2021-04-04: 284 mg via SUBCUTANEOUS
  Filled 2021-04-04: qty 1.5

## 2021-04-04 MED ORDER — INCLISIRAN SODIUM 284 MG/1.5ML ~~LOC~~ SOSY: 284.0000 mg | PREFILLED_SYRINGE | Freq: Once | SUBCUTANEOUS | Status: AC

## 2021-04-12 ENCOUNTER — Ambulatory Visit (INDEPENDENT_AMBULATORY_CARE_PROVIDER_SITE_OTHER): Payer: Medicare Other | Admitting: Internal Medicine

## 2021-04-12 ENCOUNTER — Encounter: Payer: Self-pay | Admitting: Internal Medicine

## 2021-04-12 ENCOUNTER — Other Ambulatory Visit: Payer: Self-pay

## 2021-04-12 VITALS — BP 130/70 | HR 82 | Ht 69.0 in | Wt 194.0 lb

## 2021-04-12 DIAGNOSIS — I10 Essential (primary) hypertension: Secondary | ICD-10-CM | POA: Diagnosis not present

## 2021-04-12 DIAGNOSIS — I2510 Atherosclerotic heart disease of native coronary artery without angina pectoris: Secondary | ICD-10-CM | POA: Diagnosis not present

## 2021-04-12 DIAGNOSIS — I2583 Coronary atherosclerosis due to lipid rich plaque: Secondary | ICD-10-CM | POA: Diagnosis not present

## 2021-04-12 DIAGNOSIS — E785 Hyperlipidemia, unspecified: Secondary | ICD-10-CM

## 2021-04-12 DIAGNOSIS — Z789 Other specified health status: Secondary | ICD-10-CM

## 2021-04-12 MED ORDER — FENOFIBRATE 134 MG PO CAPS: 134.0000 mg | ORAL_CAPSULE | Freq: Every day | ORAL | 3 refills | Status: DC

## 2021-04-12 NOTE — Patient Instructions (Addendum)
Medication Instructions:   Pick up fenofibrate from the pharmacy  *If you need a refill on your cardiac medications before your next appointment, please call your pharmacy*   Lab Work: Fasting lipids in February 2023, may be done at [facility], outpatient lab    If you have labs (blood work) drawn today and your tests are completely normal, you will receive your results only by: MyChart Message (if you have MyChart) OR A paper copy in the mail If you have any lab test that is abnormal or we need to change your treatment, we will call you to review the results.   Testing/Procedures: None today    Follow-Up: At [facility], you and your health needs are our priority.  As part of our continuing mission to provide you with exceptional heart care, we have created designated Provider Care Teams.  These Care Teams include your primary Cardiologist (physician) and Advanced Practice Providers (APPs -  Physician Assistants and Nurse Practitioners) who all work together to provide you with the care you need, when you need it.  We recommend signing up for the patient portal called "MyChart".  Sign up information is provided on this After Visit Summary.  MyChart is used to connect with patients for Virtual Visits (Telemedicine).  Patients are able to view lab/test results, encounter notes, upcoming appointments, etc.  Non-urgent messages can be sent to your provider as well.   To learn more about what you can do with MyChart, go to [email or web address].    Your next appointment:  Virtual Visit on 08/03/2021   The format for your next appointment:   In Person  Provider:   Dr.Hilty    Other Instructions None

## 2021-04-12 NOTE — Progress Notes (Signed)
OFFICE NOTE  Chief Complaint:  Annual follow-up  Primary Care Physician: [facility]  HPI:  Charles Martinez is a 70 year old gentleman with a history of dyslipidemia, hypokalemia, hypertension, and possibly either occluded distal LAD or congenitally occluded LAD distally. He has had problems with sciatic pain and recently had back surgery with marked improvement. He has also had dyslipidemia but has been intolerant to statins and is only fenofibrate at this time. After that episode in 2011 with hypertension and hypokalemia he was on a number of blood pressure medicines. However, recently he has been feeling fatigued and dizzy. He checked his blood pressure and it was in the 80s systolic and he has since then cut back on his medicines. He has discontinued labetalol altogether and previously was on 200 b.i.d. He recently stopped his hydralazine due to low blood pressure and is only on ramipril 10 mg and aspirin 81 mg daily. He also takes Aldactone 50 mg daily. He has no complaints as far as chest pain, worsening shortness of breath, palpitations, presyncope, or syncopal symptoms.   Charles Martinez returns today for follow-up. He denies any chest pain or worsening shortness of breath. He has had a couple episodes where he felt that he was not in often quickly woke up. This does not happen while driving but he did have an episode about 2 years ago. We have been decreasing his blood pressure medications although seems to be stable now only on ramipril and Aldactone.  I saw Charles Martinez back today in the office. Overall he seems to be doing fairly well. He continues to have problems with dizziness which are related to inner ear issues. Blood pressure appears to be well-controlled today 128/86. In fact it is been fairly stable. Previously had been on a lot of blood pressure medicine and then he started to cut the medicine back after what sounded like a syncopal episode. This happened  while driving, but he's had no further episodes since this last episode. He denies any chest pain or worsening shortness of breath. Recently had cholesterol profile obtained which shows a elevated cholesterol and LDL greater than 130. He reports he is not interested in taking cholesterol medication because he really does not feel that cholesterol medications are beneficial.  06/21/2016  Charles Martinez was seen today in follow-up. He seems to be doing fairly well. He denies any chest pain or worsening shortness of breath. His most recent cholesterol profile from 2016 indicated that LDL-C was 168. He has not been on cholesterol medication due to intolerance in the past. His previously been on Lipitor and Crestor and developed myalgias. 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 I discussed options with him today including the possibility of enrolling in the Orion-10 trial to evaluate a novel siRNA that inhibits PC SK 9 receptor expression and can significantly lower cholesterol. He did seem interested in this study.  06/22/2017  Charles Martinez was seen today for yearly follow-up.  Over the past year he is done well denies any chest pain or worsening shortness of breath.  He has been involved in the [employer] study.  This is scheduled to conclude with his follow-up visit in April 2019.  Unfortunately he is blinded and we are not sure whether or not he is receiving therapy or not, but seems  to be doing well.  Blood pressure is adequately controlled today.  EKG shows no ischemic changes.  01/31/2018  I saw Charles Martinez back today in follow-up.  Overall he is doing well.  He denies any chest pain or shortness of breath.  Fortunately he is continued in the Orion-10 study and now has been rolled over to [place]- 8.  I have been blinded to his lipid profiles.   However, and the new arm of the study he will be getting drug no matter what.  He has had no new symptoms or new cardiovascular events.  Blood pressure is well controlled.  02/24/2019  Charles Martinez returns today for follow-up.  Overall he continues to do well denies any chest pain or worsening shortness of breath.  He has rolled over into the treatment arm of Orion although his lipids have still been blinded.  I will reach out to the study coordinator to see if at this point I could be privy to the data.  We have not repeated a lipid profile done recently.  Blood pressure appears to be well controlled today.  Weight is reasonable.  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  03/24/2020  Charles Martinez seen today for annual follow-up.  Overall he seems to be feeling well.  He is switched over to the active treatment arm of the Orion 8 study and is on inclisiran.  At this point it is okay for [place] to assess his lipids and I would like to see his response to therapy.  Blood pressure is excellent today 126/64.  Weight is reasonable.  He is denied any chest pain or worsening shortness of breath.  EKG shows a sinus rhythm.  04/12/2021  Charles Martinez is seen today in follow-up.  He is come out of the Orion-8 trial and fortunately was on therapy.  He has been offered brand-name Leqvio and qualified by insurance.  He had his first injection about a week ago.  Since he had a ready been on therapy, we would continue him on a 5-month cycle and his next shot would be in 6 months.  He is noted to have elevated triglycerides.  I had previously recommended fenofibrate however he did not get the prescription filled and has not started the medicine.  PMHx:  Past Medical History:  Diagnosis Date   BPH (benign prostatic hypertrophy)    Coronary artery disease due to lipid rich plaque    cardiologist-  dr Rennis Golden   Dyslipidemia    intolerant to statins   Family history of adverse reaction to anesthesia    uncle  never regained responsiveness   Hearing loss in left ear    Hypertension    Meniere's disease    Morton's neuroma    OSA (obstructive sleep apnea)    no cpap  s/p  UVPP in 2000   Phimosis    Skin disorder    Tinnitus     Past Surgical History:  Procedure Laterality Date   CARDIAC CATHETERIZATION  early 2000's (per dr Linzie Boursiquot note)   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   CARDIOVASCULAR STRESS TEST  11-08-2007  dr Jerran Tappan   normal nuclear study/  no ischemia or infarct/ normal LV function and wall motion , ef 69%   CIRCUMCISION N/A 03/22/2015   Procedure: CIRCUMCISION ADULT;  Surgeon: Marcine Matar, MD;  Location: [facility];  Service: Urology;  Laterality: N/A;   FOOT SURGERY  2001   LASIK  LUMBAR MICRODISCECTOMY  09-14-2010   L5 -- S1   TRANSTHORACIC ECHOCARDIOGRAM  03-25-2010   normal LV, ef 60-655/  mild MR and TR   UVULOPALATOPHARYNGOPLASTY      FAMHx:  Family History  Problem Relation Age of Onset   Intracerebral hemorrhage Father     SOCHx:   reports that he quit smoking about 25 years ago. His smoking use included cigarettes. He has never used smokeless tobacco. He reports current alcohol use. He reports that he does not use drugs.  ALLERGIES:  Allergies  Allergen Reactions   Bystolic [Nebivolol Hcl] Shortness Of Breath   Hydrazine Yellow [Tartrazine] Shortness Of Breath        Crestor [Rosuvastatin Calcium] Other (See Comments)    Myalgias    Doxycycline Other (See Comments)    Other   Hctz [Hydrochlorothiazide]    Lipitor [Atorvastatin] Other (See Comments)    myalgias    ROS: Pertinent items noted in HPI and remainder of comprehensive ROS otherwise negative.  HOME MEDS: Current Outpatient Medications  Medication Sig Dispense Refill   ACETYLCARN-ALPHA LIPOIC ACID PO Take by mouth daily.     AMBULATORY NON FORMULARY MEDICATION Inject 300 mg into the skin as directed. Medication Name:Inclirisan sodium vs  placebo-Orion-10 Study-Medicine provided per protocol     Ascorbic Acid (VITAMIN C PO) Take by mouth daily.     B Complex Vitamins (B COMPLEX 100 PO) Take 1 tablet by mouth daily.     Biotin 5000 MCG TABS Take 1 tablet by mouth daily.     Cholecalciferol (VITAMIN D-3) 1000 UNITS CAPS Take 2,000 Units by mouth daily.     clonazePAM (KLONOPIN) 0.5 MG tablet Take 0.5 mg by mouth 2 (two) times daily as needed for anxiety.     Cod Liver Oil 1000 MG CAPS Take 1 capsule by mouth daily.     Coenzyme Q10 (CO Q-10) 100 MG CAPS Take 1 capsule by mouth daily.     fenofibrate micronized (LOFIBRA) 134 MG capsule Take 1 capsule (134 mg total) by mouth daily before breakfast. 90 capsule 3   finasteride (PROSCAR) 5 MG tablet Take 5 mg by mouth daily.     inclisiran (LEQVIO) 284 MG/1.5ML SOSY injection Inject 284 mg into the skin once. Every 6 months     Lutein-Zeaxanthin 20-0.8 MG CAPS Take 1 capsule by mouth every morning.     magnesium oxide (MAG-OX) 400 MG tablet Take 400 mg by mouth daily.     ramipril (ALTACE) 10 MG capsule Take 1 capsule (10 mg total) by mouth daily. (Patient taking differently: Take 5 mg by mouth daily.) 90 capsule 3   spironolactone (ALDACTONE) 50 MG tablet Take 50 mg by mouth daily.     ZINC SULFATE PO Take 1 tablet by mouth daily.     No current facility-administered medications for this visit.    LABS/IMAGING: No results found for this or any previous visit (from the past 48 hour(s)). No results found.  VITALS: BP 130/70   Pulse 82   Ht 5\' 9"  (1.753 m)   Wt 194 lb (88 kg)   SpO2 97%   BMI 28.65 kg/m   EXAM: General appearance: alert and no distress Neck: no carotid bruit and no JVD Lungs: clear to auscultation bilaterally Heart: regular rate and rhythm, S1, S2 normal, no murmur, click, rub or gallop Abdomen: soft, non-tender; bowel sounds normal; no masses,  no organomegaly Extremities: extremities normal, atraumatic, no cyanosis or edema Pulses: 2+ and  symmetric  Skin: Skin color, texture, turgor normal. 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   Charles Martinez has completed Orion-8 and now is on brand-name Leqvio which was administered just a few weeks ago.  He will stick on an every 19-month schedule.  His triglycerides remained elevated.  I had recommended fenofibrate however he never took the medication.  We will renew the prescription I advised him to start taking it.  Plan repeat lipids in about 3 months.  He has a scheduled appointment in February but because of concerns about the winter weather, he would like to make the visit virtual.  We will follow-up on his lipids at that time.  Chrystie Nose, MD, [facility], FACP  [place]  [facility]  Medical Director of the Advanced Lipid Disorders &  Cardiovascular Risk Reduction Clinic Diplomate of the American Board of Clinical Lipidology Attending Cardiologist  Direct Dial: [phone]  Fax: [phone]  Website:  [email or web address] 04/12/2021, 3:03 PM'

## 2021-05-02 NOTE — Addendum Note (Signed)
Addended by: Loletha Carrow D on: 05/02/2021 04:16 PM   Modules accepted: Orders

## 2021-05-02 NOTE — Research (Addendum)
Late entry. Study medication was completed on April 19,2022. Medication is completed per protocol.

## 2021-08-03 ENCOUNTER — Other Ambulatory Visit: Payer: Self-pay

## 2021-08-03 ENCOUNTER — Ambulatory Visit (INDEPENDENT_AMBULATORY_CARE_PROVIDER_SITE_OTHER): Payer: Medicare Other | Admitting: Internal Medicine

## 2021-08-03 ENCOUNTER — Encounter: Payer: Self-pay | Admitting: Internal Medicine

## 2021-08-03 VITALS — BP 132/68 | HR 76 | Ht 68.0 in | Wt 193.2 lb

## 2021-08-03 DIAGNOSIS — M791 Myalgia, unspecified site: Secondary | ICD-10-CM | POA: Diagnosis not present

## 2021-08-03 DIAGNOSIS — I2510 Atherosclerotic heart disease of native coronary artery without angina pectoris: Secondary | ICD-10-CM

## 2021-08-03 DIAGNOSIS — T466X5A Adverse effect of antihyperlipidemic and antiarteriosclerotic drugs, initial encounter: Secondary | ICD-10-CM

## 2021-08-03 DIAGNOSIS — I10 Essential (primary) hypertension: Secondary | ICD-10-CM | POA: Diagnosis not present

## 2021-08-03 DIAGNOSIS — E785 Hyperlipidemia, unspecified: Secondary | ICD-10-CM | POA: Diagnosis not present

## 2021-08-03 DIAGNOSIS — I2583 Coronary atherosclerosis due to lipid rich plaque: Secondary | ICD-10-CM | POA: Diagnosis not present

## 2021-08-03 NOTE — Progress Notes (Signed)
OFFICE NOTE  Chief Complaint:  Annual follow-up  [facility]  HPI:  Charles DeltonRoger L Martinez is a 71 year old gentleman with a history of dyslipidemia, hypokalemia, hypertension, and possibly either occluded distal LAD or congenitally occluded LAD distally. He has had problems with sciatic pain and recently had back surgery with marked improvement. He has also had dyslipidemia but has been intolerant to statins and is only fenofibrate at this time. After that episode in 2011 with hypertension and hypokalemia he was on a number of blood pressure medicines. However, recently he has been feeling fatigued and dizzy. He checked his blood pressure and it was in the 80s systolic and he has since then cut back on his medicines. He has discontinued labetalol altogether and previously was on 200 b.i.d. He recently stopped his hydralazine due to low blood pressure and is only on ramipril 10 mg and aspirin 81 mg daily. He also takes Aldactone 50 mg daily. He has no complaints as far as chest pain, worsening shortness of breath, palpitations, presyncope, or syncopal symptoms.   Charles Martinez returns today for follow-up. He denies any chest pain or worsening shortness of breath. He has had a couple episodes where he felt that he was not in often quickly woke up. This does not happen while driving but he did have an episode about 2 years ago. We have been decreasing his blood pressure medications although seems to be stable now only on ramipril and Aldactone.  I saw Charles Martinez back today in the office. Overall he seems to be doing fairly well. He continues to have problems with dizziness which are related to inner ear issues. Blood pressure appears to be well-controlled today 128/86. In fact it is been fairly stable. Previously had been on a lot of blood pressure medicine and then he started to cut the medicine back after what sounded like a syncopal episode. This happened  while driving, but he's had no further episodes since this last episode. He denies any chest pain or worsening shortness of breath. Recently had cholesterol profile obtained which shows a elevated cholesterol and LDL greater than 130. He reports he is not interested in taking cholesterol medication because he really does not feel that cholesterol medications are beneficial.  06/21/2016  Charles Martinez was seen today in follow-up. He seems to be doing fairly well. He denies any chest pain or worsening shortness of breath. His most recent cholesterol profile from 2016 indicated that LDL-C was 168. He has not been on cholesterol medication due to intolerance in the past. His previously been on Lipitor and Crestor and developed myalgias. 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 I discussed options with him today including the possibility of enrolling in the Orion-10 trial to evaluate a novel siRNA that inhibits PC SK 9 receptor expression and can significantly lower cholesterol. He did seem interested in this study.  06/22/2017  Charles Martinez was seen today for yearly follow-up.  Over the past year he is done well denies any chest pain or worsening shortness of breath.  He has been involved in the [employer] research study.  This is scheduled to conclude with his follow-up visit in April 2019.  Unfortunately he is blinded and we are not sure whether or not he is receiving therapy or not, but seems  to be doing well.  Blood pressure is adequately controlled today.  EKG shows no ischemic changes.  01/31/2018  I saw Charles Martinez back today in follow-up.  Overall he is doing well.  He denies any chest pain or shortness of breath.  Fortunately he is continued in the Orion-10 study and now has been rolled over to [place]- 8.  I have been blinded to his lipid profiles.   However, and the new arm of the study he will be getting drug no matter what.  He has had no new symptoms or new cardiovascular events.  Blood pressure is well controlled.  02/24/2019  Charles Martinez returns today for follow-up.  Overall he continues to do well denies any chest pain or worsening shortness of breath.  He has rolled over into the treatment arm of Orion although his lipids have still been blinded.  I will reach out to the study coordinator to see if at this point I could be privy to the data.  We have not repeated a lipid profile done recently.  Blood pressure appears to be well controlled today.  Weight is reasonable.  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  03/24/2020  Charles Martinez seen today for annual follow-up.  Overall he seems to be feeling well.  He is switched over to the active treatment arm of the Orion 8 study and is on inclisiran.  At this point it is okay for [place] to assess his lipids and I would like to see his response to therapy.  Blood pressure is excellent today 126/64.  Weight is reasonable.  He is denied any chest pain or worsening shortness of breath.  EKG shows a sinus rhythm.  04/12/2021  Charles Martinez is seen today in follow-up.  He is come out of the Orion-8 trial and fortunately was on therapy.  He has been offered brand-name Leqvio and qualified by insurance.  He had his first injection about a week ago.  Since he had a ready been on therapy, we would continue him on a 60-month cycle and his next shot would be in 6 months.  He is noted to have elevated triglycerides.  I had previously recommended fenofibrate however he did not get the prescription filled and has not started the medicine.  08/03/2021  Charles Martinez returns today for follow-up.  Overall he is doing well.  He has had 1 dose of Leqvio after transitioning out of the Orion-8 study.  He is due for another injection in April.  He has not had repeat lipids.  I like to get those however he said he made  some dietary changes and his wife has been diagnosed with alpha gal and they will need to make more dietary changes therefore he would like to wait.  PMHx:  Past Medical History:  Diagnosis Date   BPH (benign prostatic hypertrophy)    Coronary artery disease due to lipid rich plaque    cardiologist-  dr Rennis Golden   Dyslipidemia    intolerant to statins   Family history of adverse reaction to anesthesia    uncle never regained responsiveness   Hearing loss in left ear    Hypertension    Meniere's disease    Morton's neuroma    OSA (obstructive sleep apnea)    no cpap  s/p  UVPP in 2000   Phimosis    Skin disorder    Tinnitus     Past Surgical History:  Procedure Laterality Date   CARDIAC  CATHETERIZATION  early 2000's (per dr Keely Drennan note)   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   CARDIOVASCULAR STRESS TEST  11-08-2007  dr Rameen Gohlke   normal nuclear study/  no ischemia or infarct/ normal LV function and wall motion , ef 69%   CIRCUMCISION N/A 03/22/2015   Procedure: CIRCUMCISION ADULT;  Surgeon: Marcine Matar, MD;  Location: [facility];  Service: Urology;  Laterality: N/A;   FOOT SURGERY  2001   LASIK     LUMBAR MICRODISCECTOMY  09-14-2010   L5 -- S1   TRANSTHORACIC ECHOCARDIOGRAM  03-25-2010   normal LV, ef 60-655/  mild MR and TR   UVULOPALATOPHARYNGOPLASTY      FAMHx:  Family History  Problem Relation Age of Onset   Intracerebral hemorrhage Father     SOCHx:   reports that he quit smoking about 26 years ago. His smoking use included cigarettes. He has never used smokeless tobacco. He reports current alcohol use. He reports that he does not use drugs.  ALLERGIES:  Allergies  Allergen Reactions   Bystolic [Nebivolol Hcl] Shortness Of Breath   Hydrazine Yellow [Tartrazine] Shortness Of Breath        Crestor [Rosuvastatin Calcium] Other (See Comments)    Myalgias    Doxycycline Other (See Comments)    Other   Hctz  [Hydrochlorothiazide]    Lipitor [Atorvastatin] Other (See Comments)    myalgias    ROS: Pertinent items noted in HPI and remainder of comprehensive ROS otherwise negative.  HOME MEDS: Current Outpatient Medications  Medication Sig Dispense Refill   ACETYLCARN-ALPHA LIPOIC ACID PO Take by mouth daily.     Ascorbic Acid (VITAMIN C PO) Take by mouth daily.     B Complex Vitamins (B COMPLEX 100 PO) Take 1 tablet by mouth daily.     Biotin 5000 MCG TABS Take 1 tablet by mouth daily.     Cholecalciferol (VITAMIN D-3) 1000 UNITS CAPS Take 2,000 Units by mouth daily.     clonazePAM (KLONOPIN) 0.5 MG tablet Take 0.5 mg by mouth 2 (two) times daily as needed for anxiety.     Cod Liver Oil 1000 MG CAPS Take 1 capsule by mouth daily.     Coenzyme Q10 (CO Q-10) 100 MG CAPS Take 1 capsule by mouth daily.     finasteride (PROSCAR) 5 MG tablet Take 5 mg by mouth daily.     inclisiran (LEQVIO) 284 MG/1.5ML SOSY injection Inject 284 mg into the skin once. Every 6 months     Lutein-Zeaxanthin 20-0.8 MG CAPS Take 1 capsule by mouth every morning.     magnesium oxide (MAG-OX) 400 MG tablet Take 400 mg by mouth daily.     ramipril (ALTACE) 10 MG capsule Take 1 capsule (10 mg total) by mouth daily. (Patient taking differently: Take 5 mg by mouth daily.) 90 capsule 3   spironolactone (ALDACTONE) 50 MG tablet Take 50 mg by mouth daily.     ZINC SULFATE PO Take 1 tablet by mouth daily.     fenofibrate micronized (LOFIBRA) 134 MG capsule Take 1 capsule (134 mg total) by mouth daily before breakfast. (Patient not taking: Reported on 08/03/2021) 90 capsule 3   No current facility-administered medications for this visit.    LABS/IMAGING: No results found for this or any previous visit (from the past 48 hour(s)). No results found.  VITALS: BP 132/68    Pulse 76    Ht 5\' 8"  (1.727 m)    Wt  193 lb 3.2 oz (87.6 kg)    SpO2 98%    BMI 29.38 kg/m   EXAM: General appearance: alert and no distress Neck: no  carotid bruit and no JVD Lungs: clear to auscultation bilaterally Heart: regular rate and rhythm, S1, S2 normal, no murmur, click, rub or gallop Abdomen: soft, non-tender; bowel sounds normal; no masses,  no organomegaly Extremities: extremities normal, atraumatic, no cyanosis or edema Pulses: 2+ and symmetric Skin: Skin color, texture, turgor normal. 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   Mr. Luna had elevated triglycerides but is hesitant to take fenofibrate because of concerns about side effects.  Therefore we will remove it from his list.  I have advised repeat lipids today but he notes that he is good to be making major dietary changes on behalf of his wife and some health problems she is having therefore he does not want to check lipids at this time would rather wait until about 6 months.  He is due for another Leqvio injection in April.  We will plan lipids in 6 months and follow-up with me afterwards.  Chrystie Nose, MD, [facility], FACP  [place]   [facility]  Medical Director of the Advanced Lipid Disorders &  Cardiovascular Risk Reduction Clinic Diplomate of the American Board of Clinical Lipidology Attending Cardiologist  Direct Dial: [phone]   Fax: [phone]  Website:  [email or web address] 08/03/2021, 10:41 AM'

## 2021-08-03 NOTE — Patient Instructions (Addendum)
Medication Instructions:  OK to stop fenofibrate  *If you need a refill on your cardiac medications before your next appointment, please call your pharmacy*   Lab Work: FASTING lab work in about 6 months  If you have labs (blood work) drawn today and your tests are completely normal, you will receive your results only by: MyChart Message (if you have MyChart) OR A paper copy in the mail If you have any lab test that is abnormal or we need to change your treatment, we will call you to review the results.  Follow-Up: At [facility], you and your health needs are our priority.  As part of our continuing mission to provide you with exceptional heart care, we have created designated Provider Care Teams.  These Care Teams include your primary Cardiologist (physician) and Advanced Practice Providers (APPs -  Physician Assistants and Nurse Practitioners) who all work together to provide you with the care you need, when you need it.  We recommend signing up for the patient portal called "MyChart".  Sign up information is provided on this After Visit Summary.  MyChart is used to connect with patients for Virtual Visits (Telemedicine).  Patients are able to view lab/test results, encounter notes, upcoming appointments, etc.  Non-urgent messages can be sent to your provider as well.   To learn more about what you can do with MyChart, go to [email or web address].    Your next appointment:   6 months with Dr. Rennis Golden  -- [facility] in [place] OR [place]   Other Instructions  Your Charles Martinez injection is scheduled on 10/03/2021 at 12:30pm. Please arrive at [facility] and enter the hospital through Entrance A (the [employer]) that's located at [address] [employer] 15 minutes before your scheduled injection time. Walk in to the registration desk and let them know that you are scheduled for an injection in the infusion center. They will register you and take you to your appointment.

## 2021-08-11 ENCOUNTER — Other Ambulatory Visit: Payer: Self-pay | Admitting: Internal Medicine

## 2021-08-11 DIAGNOSIS — I2510 Atherosclerotic heart disease of native coronary artery without angina pectoris: Secondary | ICD-10-CM

## 2021-08-11 DIAGNOSIS — T466X5A Adverse effect of antihyperlipidemic and antiarteriosclerotic drugs, initial encounter: Secondary | ICD-10-CM

## 2021-08-11 DIAGNOSIS — I2583 Coronary atherosclerosis due to lipid rich plaque: Secondary | ICD-10-CM

## 2021-08-11 DIAGNOSIS — E785 Hyperlipidemia, unspecified: Secondary | ICD-10-CM

## 2021-10-03 ENCOUNTER — Ambulatory Visit (HOSPITAL_COMMUNITY)
Admission: RE | Admit: 2021-10-03 | Discharge: 2021-10-03 | Disposition: A | Discharge status: Home / Self Care | Payer: Medicare Other | Source: Ambulatory Visit | Attending: Internal Medicine | Admitting: Internal Medicine

## 2021-10-03 DIAGNOSIS — I2583 Coronary atherosclerosis due to lipid rich plaque: Secondary | ICD-10-CM | POA: Insufficient documentation

## 2021-10-03 DIAGNOSIS — T466X5A Adverse effect of antihyperlipidemic and antiarteriosclerotic drugs, initial encounter: Secondary | ICD-10-CM | POA: Insufficient documentation

## 2021-10-03 DIAGNOSIS — E785 Hyperlipidemia, unspecified: Secondary | ICD-10-CM | POA: Insufficient documentation

## 2021-10-03 DIAGNOSIS — I2510 Atherosclerotic heart disease of native coronary artery without angina pectoris: Secondary | ICD-10-CM | POA: Diagnosis present

## 2021-10-03 DIAGNOSIS — M791 Myalgia, unspecified site: Secondary | ICD-10-CM | POA: Insufficient documentation

## 2021-10-03 MED ORDER — INCLISIRAN SODIUM 284 MG/1.5ML ~~LOC~~ SOSY
PREFILLED_SYRINGE | SUBCUTANEOUS | Status: AC
  Filled 2021-10-03: qty 1.5

## 2021-10-03 MED ORDER — INCLISIRAN SODIUM 284 MG/1.5ML ~~LOC~~ SOSY
284.0000 mg | PREFILLED_SYRINGE | Freq: Once | SUBCUTANEOUS | Status: AC
  Administered 2021-10-03: 284 mg via SUBCUTANEOUS

## 2021-11-03 ENCOUNTER — Encounter: Payer: Self-pay | Admitting: Internal Medicine

## 2021-12-01 ENCOUNTER — Ambulatory Visit (AMBULATORY_SURGERY_CENTER): Payer: Self-pay | Admitting: *Deleted

## 2021-12-01 VITALS — Ht 69.0 in | Wt 186.0 lb

## 2021-12-01 DIAGNOSIS — Z1211 Encounter for screening for malignant neoplasm of colon: Secondary | ICD-10-CM

## 2021-12-01 MED ORDER — NA SULFATE-K SULFATE-MG SULF 17.5-3.13-1.6 GM/177ML PO SOLN: 1.0000 | Freq: Once | ORAL | 0 refills | Status: DC

## 2021-12-01 MED ORDER — PLENVU 140 G PO SOLR: 1.0000 | ORAL | 0 refills | Status: DC

## 2021-12-01 NOTE — Progress Notes (Signed)
No egg or soy allergy known to patient  No issues known to pt with past sedation with any surgeries or procedures Patient denies ever being told they had issues or difficulty with intubation  No FH of Malignant Hyperthermia Pt is not on diet pills Pt is not on  home 02  Pt is not on blood thinners  Pt denies issues with constipation  No A fib or A flutter Pt requested Plenvu for prep- Plenvu Medicare Coupon to pt in PV today- Pharmacy notified pt has coupon

## 2021-12-29 ENCOUNTER — Ambulatory Visit (AMBULATORY_SURGERY_CENTER): Payer: Medicare Other | Admitting: Internal Medicine

## 2021-12-29 ENCOUNTER — Encounter: Payer: Self-pay | Admitting: Internal Medicine

## 2021-12-29 VITALS — BP 140/93 | HR 72 | Temp 97.1°F | Resp 20 | Ht 69.0 in | Wt 186.0 lb

## 2021-12-29 DIAGNOSIS — Z1211 Encounter for screening for malignant neoplasm of colon: Secondary | ICD-10-CM | POA: Diagnosis not present

## 2021-12-29 MED ORDER — SODIUM CHLORIDE 0.9 % IV SOLN: 500.0000 mL | Freq: Once | INTRAVENOUS | Status: DC

## 2021-12-29 NOTE — Progress Notes (Signed)
PT taken to PACU. Monitors in place. VSS. Report given to RN. 

## 2021-12-29 NOTE — Patient Instructions (Signed)
HANDOUTS PROVIDED ON: DIVERTICULOSIS  You may resume your previous diet and medication schedule.  Thank you for allowing [place] to care for you today!!!   YOU HAD AN ENDOSCOPIC PROCEDURE TODAY AT THE [facility]:   Refer to the procedure report that was given to you for any specific questions about what was found during the examination.  If the procedure report does not answer your questions, please call your gastroenterologist to clarify.  If you requested that your care partner not be given the details of your procedure findings, then the procedure report has been included in a sealed envelope for you to review at your convenience later.  YOU SHOULD EXPECT: Some feelings of bloating in the abdomen. Passage of more gas than usual.  Walking can help get rid of the air that was put into your GI tract during the procedure and reduce the bloating. If you had a lower endoscopy (such as a colonoscopy or flexible sigmoidoscopy) you may notice spotting of blood in your stool or on the toilet paper. If you underwent a bowel prep for your procedure, you may not have a normal bowel movement for a few days.  Please Note:  You might notice some irritation and congestion in your nose or some drainage.  This is from the oxygen used during your procedure.  There is no need for concern and it should clear up in a day or so.  SYMPTOMS TO REPORT IMMEDIATELY:  Following lower endoscopy (colonoscopy or flexible sigmoidoscopy):  Excessive amounts of blood in the stool  Significant tenderness or worsening of abdominal pains  Swelling of the abdomen that is new, acute  Fever of 100F or higher  For urgent or emergent issues, a gastroenterologist can be reached at any hour by calling [phone]. Do not use MyChart messaging for urgent concerns.    DIET:  We do recommend a small meal at first, but then you may proceed to your regular diet.  Drink plenty of fluids but you should avoid alcoholic beverages  for 24 hours.  ACTIVITY:  You should plan to take it easy for the rest of today and you should NOT DRIVE or use heavy machinery until tomorrow (because of the sedation medicines used during the test).    FOLLOW UP: Our staff will call the number listed on your records the next business day following your procedure.  We will call around 7:15- 8:00 am to check on you and address any questions or concerns that you may have regarding the information given to you following your procedure. If we do not reach you, we will leave a message.  If you develop any symptoms (ie: fever, flu-like symptoms, shortness of breath, cough etc.) before then, please call [phone].  If you test positive for Covid 19 in the 2 weeks post procedure, please call and report this information to [place].    If any biopsies were taken you will be contacted by phone or by letter within the next 1-3 weeks.  Please call us at [phone] if you have not heard about the biopsies in 3 weeks.    SIGNATURES/CONFIDENTIALITY: You and/or your care partner have signed paperwork which will be entered into your electronic medical record.  These signatures attest to the fact that that the information above on your After Visit Summary has been reviewed and is understood.  Full responsibility of the confidentiality of this discharge information lies with you and/or your care-partner.

## 2021-12-29 NOTE — Progress Notes (Signed)
Pt's states no medical or surgical changes since previsit or office visit. 

## 2021-12-29 NOTE — Op Note (Signed)
[facility] Patient Name: Charles Martinez Procedure Date: 12/29/2021 11:44 AM MRN: [id number] Endoscopist: Beverley Fiedler , MD Age: 71 Referring MD:  Date of Birth: July 16, 1950 Gender: Male Account #: [id number] Procedure:                Colonoscopy Indications:              Screening for colorectal malignant neoplasm Medicines:                Monitored Anesthesia Care Procedure:                Pre-Anesthesia Assessment:                           - Prior to the procedure, a History and Physical                            was performed, and patient medications and                            allergies were reviewed. The patient's tolerance of                            previous anesthesia was also reviewed. The risks                            and benefits of the procedure and the sedation                            options and risks were discussed with the patient.                            All questions were answered, and informed consent                            was obtained. Prior Anticoagulants: The patient has                            taken no previous anticoagulant or antiplatelet                            agents. ASA Grade Assessment: III - A patient with                            severe systemic disease. After reviewing the risks                            and benefits, the patient was deemed in                            satisfactory condition to undergo the procedure.                           After obtaining informed consent, the colonoscope  was passed under direct vision. Throughout the                            procedure, the patient's blood pressure, pulse, and                            oxygen saturations were monitored continuously. The                            Colonoscope was introduced through the anus and                            advanced to the cecum, identified by appendiceal                            orifice and ileocecal  valve. The colonoscopy was                            performed without difficulty. The patient tolerated                            the procedure well. The quality of the bowel                            preparation was excellent. The ileocecal valve,                            appendiceal orifice, and rectum were photographed. Scope In: 11:51:30 AM Scope Out: 12:02:51 PM Scope Withdrawal Time: 0 hours 8 minutes 55 seconds  Total Procedure Duration: 0 hours 11 minutes 21 seconds  Findings:                 The digital rectal exam was normal.                           Three small (2) and medium-sized (1) angioectasias                            without bleeding and with typical arborization were                            found in the ascending colon.                           Multiple small and large-mouthed diverticula were                            found in the sigmoid colon, descending colon,                            transverse colon and ascending colon.                           The retroflexed view of the distal rectum and anal  verge was normal and showed no anal or rectal                            abnormalities. Complications:            No immediate complications. Estimated Blood Loss:     Estimated blood loss: none. 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                           - Moderate diverticulosis in the sigmoid colon, in                            the descending colon, in the transverse colon and                            in the ascending colon.                           - The distal rectum and anal verge are normal on                            retroflexion view.                           - No specimens collected. Recommendation:           - Patient has a contact number available for                            emergencies. The signs and symptoms of potential                            delayed  complications were discussed with the                            patient. Return to normal activities tomorrow.                            Written discharge instructions were provided to the                            patient.                           - Resume previous diet.                           - Continue present medications.                           - No repeat colonoscopy due to age at next                            screening interval (>80 yrs) and the absence of  colonic polyps on today's exam. Beverley Fiedler, MD 12/29/2021 12:06:28 PM This report has been signed electronically.

## 2021-12-29 NOTE — Progress Notes (Signed)
GASTROENTEROLOGY PROCEDURE H&P NOTE   Primary Care Physician: [facility]    Reason for Procedure:  Colon cancer screening  Plan:    Colonoscopy  Patient is appropriate for endoscopic procedure(s) in the ambulatory ([facility]) setting.  The nature of the procedure, as well as the risks, benefits, and alternatives were carefully and thoroughly reviewed with the patient. Ample time for discussion and questions allowed. The patient understood, was satisfied, and agreed to proceed.     HPI: Charles Martinez is a 71 y.o. male who presents for screening colonoscopy.  Medical history as below.  Tolerated the prep.  No recent chest pain or shortness of breath.  No abdominal pain today.  Past Medical History:  Diagnosis Date   Allergy    BPH (benign prostatic hypertrophy)    Cataract    right removed, left forming   Coronary artery disease due to lipid rich plaque    cardiologist-  dr Rennis Golden   Dyslipidemia    intolerant to statins   Family history of adverse reaction to anesthesia    uncle never regained responsiveness   Hearing loss in left ear    Hyperlipidemia    Hypertension    Meniere's disease    Morton's neuroma    OSA (obstructive sleep apnea)    no cpap  s/p  UVPP in 2000   Peripheral neuropathy    feet   Phimosis    Skin disorder    Tinnitus     Past Surgical History:  Procedure Laterality Date   CARDIAC CATHETERIZATION  early 2000's (per dr hilty note)   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   CARDIOVASCULAR STRESS TEST  11-08-2007  dr hilty   normal nuclear study/  no ischemia or infarct/ normal LV function and wall motion , ef 69%   CIRCUMCISION N/A 03/22/2015   Procedure: CIRCUMCISION ADULT;  Surgeon: Marcine Matar, MD;  Location: [facility];  Service: Urology;  Laterality: N/A;   COLONOSCOPY     ~20 yrs per pt - he has had cologuard in the past that was negative   FOOT SURGERY   06/13/1999   LASIK     LUMBAR MICRODISCECTOMY  09/14/2010   L5 -- S1   MOHS SURGERY     forehead, eye lid x 2   TRANSTHORACIC ECHOCARDIOGRAM  03/25/2010   normal LV, ef 60-655/  mild MR and TR   UVULOPALATOPHARYNGOPLASTY     tonsils removed with turbinate reduction    Prior to Admission medications   Medication Sig Start Date End Date Taking? Authorizing Provider  ACETYLCARN-ALPHA LIPOIC ACID PO Take by mouth daily.   Yes [provider]  Ascorbic Acid (VITAMIN C PO) Take by mouth daily.   Yes [provider]  B Complex Vitamins (B COMPLEX 100 PO) Take 1 tablet by mouth daily.   Yes [provider]  Biotin 5000 MCG TABS Take 1 tablet by mouth daily.   Yes [provider]  Black Pepper-Turmeric (TURMERIC COMPLEX/BLACK PEPPER PO) Take by mouth.   Yes [provider]  Cholecalciferol (VITAMIN D-3) 1000 UNITS CAPS Take 2,000 Units by mouth daily.   Yes [provider]  clonazePAM (KLONOPIN) 0.5 MG tablet Take 0.5 mg by mouth 2 (two) times daily as needed for anxiety.   Yes [provider]  Cod Liver Oil 1000 MG CAPS Take 1 capsule by mouth daily.   Yes [provider]  finasteride (PROSCAR) 5  MG tablet Take 5 mg by mouth daily.   Yes [provider]  magnesium oxide (MAG-OX) 400 MG tablet Take 400 mg by mouth daily.   Yes [provider]  Probiotic Product (PROBIOTIC PO) Take by mouth.   Yes [provider]  QUERCETIN PO Take by mouth.   Yes [provider]  ramipril (ALTACE) 10 MG capsule Take 1 capsule (10 mg total) by mouth daily. Patient taking differently: Take 5 mg by mouth daily. 06/21/16  Yes Hilty, Lisette Abu, MD  spironolactone (ALDACTONE) 50 MG tablet Take 50 mg by mouth daily.   Yes [provider]  Ubiquinol 100 MG CAPS Take by mouth.   Yes [provider]  ZINC SULFATE PO Take 1 tablet by mouth daily.   Yes [provider]  inclisiran (LEQVIO)  284 MG/1.5ML SOSY injection Inject 284 mg into the skin once. Every 6 months    [provider]  Lutein-Zeaxanthin 20-0.8 MG CAPS Take 1 capsule by mouth every morning. Patient not taking: Reported on 12/01/2021    [provider]  meclizine (ANTIVERT) 25 MG tablet Take 25 mg by mouth 3 (three) times daily as needed for dizziness.    [provider]    Current Outpatient Medications  Medication Sig Dispense Refill   ACETYLCARN-ALPHA LIPOIC ACID PO Take by mouth daily.     Ascorbic Acid (VITAMIN C PO) Take by mouth daily.     B Complex Vitamins (B COMPLEX 100 PO) Take 1 tablet by mouth daily.     Biotin 5000 MCG TABS Take 1 tablet by mouth daily.     Black Pepper-Turmeric (TURMERIC COMPLEX/BLACK PEPPER PO) Take by mouth.     Cholecalciferol (VITAMIN D-3) 1000 UNITS CAPS Take 2,000 Units by mouth daily.     clonazePAM (KLONOPIN) 0.5 MG tablet Take 0.5 mg by mouth 2 (two) times daily as needed for anxiety.     Cod Liver Oil 1000 MG CAPS Take 1 capsule by mouth daily.     finasteride (PROSCAR) 5 MG tablet Take 5 mg by mouth daily.     magnesium oxide (MAG-OX) 400 MG tablet Take 400 mg by mouth daily.     Probiotic Product (PROBIOTIC PO) Take by mouth.     QUERCETIN PO Take by mouth.     ramipril (ALTACE) 10 MG capsule Take 1 capsule (10 mg total) by mouth daily. (Patient taking differently: Take 5 mg by mouth daily.) 90 capsule 3   spironolactone (ALDACTONE) 50 MG tablet Take 50 mg by mouth daily.     Ubiquinol 100 MG CAPS Take by mouth.     ZINC SULFATE PO Take 1 tablet by mouth daily.     inclisiran (LEQVIO) 284 MG/1.5ML SOSY injection Inject 284 mg into the skin once. Every 6 months     Lutein-Zeaxanthin 20-0.8 MG CAPS Take 1 capsule by mouth every morning. (Patient not taking: Reported on 12/01/2021)     meclizine (ANTIVERT) 25 MG tablet Take 25 mg by mouth 3 (three) times daily as needed for dizziness.     Current Facility-Administered Medications  Medication  Dose Route Frequency Provider Last Rate Last Admin   0.9 %  sodium chloride infusion  500 mL Intravenous Once Sarrah Fiorenza, Carie Caddy, MD        Allergies as of 12/29/2021 - Review Complete 12/29/2021  Allergen Reaction Noted   Bystolic [nebivolol hcl] Shortness Of Breath 04/24/2013   Hydrazine yellow [tartrazine] Shortness Of Breath 04/24/2013   Crestor [employer]  calcium] Other (See Comments) 06/21/2016   Doxycycline Other (See Comments) 08/22/2016   Hctz [hydrochlorothiazide]  04/24/2013   Lipitor [atorvastatin] Other (See Comments) 04/24/2013    Family History  Problem Relation Age of Onset   Intracerebral hemorrhage Father    Colon cancer Neg Hx    Colon polyps Neg Hx    Esophageal cancer Neg Hx    Rectal cancer Neg Hx    Stomach cancer Neg Hx     Social History   Socioeconomic History   Marital status: Married    Spouse name: Not on file   Number of children: Not on file   Years of education: Not on file   Highest education level: Not on file  Occupational History   Not on file  Tobacco Use   Smoking status: Former    Types: Cigarettes    Quit date: 04/25/1995    Years since quitting: 26.6   Smokeless tobacco: Never  Vaping Use   Vaping Use: Never used  Substance and Sexual Activity   Alcohol use: Yes    Comment: occasionally   Drug use: No   Sexual activity: Not on file  Other Topics Concern   Not on file  Social History Narrative   Not on file   Social Determinants of Health   Financial Resource Strain: Not on file  Food Insecurity: Not on file  Transportation Needs: Not on file  Physical Activity: Not on file  Stress: Not on file  Social Connections: Not on file  Intimate Partner Violence: Not on file    Physical Exam: Vital signs in last 24 hours: @BP  114/72   Pulse 67   Temp (!) 97.1 F (36.2 C)   Ht 5\' 9"  (1.753 m)   Wt 186 lb (84.4 kg)   SpO2 98%   BMI 27.47 kg/m  GEN: NAD EYE: Sclerae anicteric ENT: MMM CV: Non-tachycardic Pulm: CTA  b/l GI: Soft, NT/ND NEURO:  Alert & Oriented x 3   , MD [facility]  12/29/2021 11:43 AM

## 2021-12-30 ENCOUNTER — Telehealth: Payer: Self-pay | Admitting: *Deleted

## 2021-12-30 NOTE — Telephone Encounter (Signed)
  Follow up Call-     12/29/2021   10:37 AM  Call back number  Post procedure Call Back phone  # [phone]  Permission to leave phone message Yes     Patient questions:  Do you have a fever, pain , or abdominal swelling? No. Pain Score  0 *  Have you tolerated food without any problems? Yes.    Have you been able to return to your normal activities? Yes.    Do you have any questions about your discharge instructions: Diet   No. Medications  No. Follow up visit  No.  Do you have questions or concerns about your Care? No.  Actions: * If pain score is 4 or above: No action needed, pain <4.

## 2022-04-07 ENCOUNTER — Other Ambulatory Visit (HOSPITAL_COMMUNITY): Payer: Self-pay

## 2022-04-07 ENCOUNTER — Other Ambulatory Visit: Payer: Self-pay | Admitting: Internal Medicine

## 2022-04-07 DIAGNOSIS — E785 Hyperlipidemia, unspecified: Secondary | ICD-10-CM

## 2022-04-07 MED ORDER — INCLISIRAN SODIUM 284 MG/1.5ML ~~LOC~~ SOSY: 284.0000 mg | PREFILLED_SYRINGE | Freq: Once | SUBCUTANEOUS | Status: AC

## 2022-04-07 MED ORDER — INCLISIRAN SODIUM 284 MG/1.5ML ~~LOC~~ SOSY: 284.0000 mg | PREFILLED_SYRINGE | Freq: Once | SUBCUTANEOUS | 0 refills | Status: AC

## 2022-04-10 ENCOUNTER — Ambulatory Visit (HOSPITAL_COMMUNITY)
Admission: RE | Admit: 2022-04-10 | Discharge: 2022-04-10 | Disposition: A | Discharge status: Home / Self Care | Payer: Medicare Other | Source: Ambulatory Visit | Attending: Internal Medicine | Admitting: Internal Medicine

## 2022-04-10 DIAGNOSIS — I10 Essential (primary) hypertension: Secondary | ICD-10-CM | POA: Diagnosis present

## 2022-04-10 MED ORDER — INCLISIRAN SODIUM 284 MG/1.5ML ~~LOC~~ SOSY
PREFILLED_SYRINGE | SUBCUTANEOUS | Status: AC
  Administered 2022-04-10: 284 mg via SUBCUTANEOUS
  Filled 2022-04-10: qty 1.5

## 2022-04-10 MED ORDER — INCLISIRAN SODIUM 284 MG/1.5ML ~~LOC~~ SOSY: 284.0000 mg | PREFILLED_SYRINGE | Freq: Once | SUBCUTANEOUS | Status: DC

## 2022-10-09 ENCOUNTER — Telehealth: Payer: Self-pay | Admitting: Internal Medicine

## 2022-10-09 ENCOUNTER — Other Ambulatory Visit: Payer: Self-pay | Admitting: Pharmacist

## 2022-10-09 DIAGNOSIS — E785 Hyperlipidemia, unspecified: Secondary | ICD-10-CM

## 2022-10-09 NOTE — Telephone Encounter (Signed)
Order has been placed.

## 2022-10-09 NOTE — Telephone Encounter (Signed)
Caller stated they will need orders for patient to have Leqvio injection tomorrow.

## 2022-10-10 ENCOUNTER — Ambulatory Visit (HOSPITAL_COMMUNITY)
Admission: RE | Admit: 2022-10-10 | Discharge: 2022-10-10 | Disposition: A | Discharge status: Home / Self Care | Payer: Medicare Other | Source: Ambulatory Visit | Attending: Internal Medicine | Admitting: Internal Medicine

## 2022-10-10 DIAGNOSIS — E785 Hyperlipidemia, unspecified: Secondary | ICD-10-CM | POA: Diagnosis present

## 2022-10-10 MED ORDER — INCLISIRAN SODIUM 284 MG/1.5ML ~~LOC~~ SOSY
284.0000 mg | PREFILLED_SYRINGE | Freq: Once | SUBCUTANEOUS | Status: AC
  Administered 2022-10-10: 284 mg via SUBCUTANEOUS

## 2022-10-10 MED ORDER — INCLISIRAN SODIUM 284 MG/1.5ML ~~LOC~~ SOSY
PREFILLED_SYRINGE | SUBCUTANEOUS | Status: AC
  Filled 2022-10-10: qty 1.5

## 2022-12-21 ENCOUNTER — Telehealth: Payer: Self-pay | Admitting: Internal Medicine

## 2022-12-21 NOTE — Telephone Encounter (Signed)
Received notification from [employer] that 10/10/22 Leqvio injection did not have an [place]. Patient changed insurance company's to which we were unaware, thus no new Leqvio BIV was completed, PA, or auth.   Contacted patient about this. Explained that his old insurance - supplement plan G - covered Leqvio at either 100% or he only had to pay the annual deductible. Explained that new insurance - UHC med advantage - will not cover Leqvio the same and he will likely have a 20% co-insurance. Explained a new benefits check will have to be completed and likely a prior authorization -- will enroll him for new auth check thru [facility] portal and he is aware to sign off on email from [email or web address]. He was notified that future injections, pending insurance approval, would be done at [facility] off [facility] and also that he will likely need patient assistance as cost per single injection is ~$1200-1800.   Total time: 18 minutes

## 2022-12-26 NOTE — Telephone Encounter (Signed)
[facility] [employer] contact said that [facility] coverage was located in March 2024 but did not flow over prior to injection appointment. This team will submit an appeal for coverage.

## 2022-12-29 ENCOUNTER — Other Ambulatory Visit: Payer: Self-pay | Admitting: Internal Medicine

## 2023-01-01 ENCOUNTER — Telehealth: Payer: Self-pay | Admitting: Pharmacy Technician

## 2023-01-01 NOTE — Telephone Encounter (Addendum)
Dr. Rennis Golden, Fyi note:  Patient will be scheduled as soon as possible.  Auth Submission: APPROVED Site of care: Site of care: [facility] Payer: [facility] MEDICARE Medication & CPT/J Code(s) submitted: Leqvio (Inclisiran) [phone] Route of submission (phone, fax, portal): PORTAL Phone # Fax # Auth type: Buy/Bill Units/visits requested: 2 DOSES Reference number: [id number] Approval from: 01/01/23 to 01/01/24   [facility]PA[facility]: APPROVED FREE DRUG

## 2023-01-05 ENCOUNTER — Telehealth: Payer: Self-pay | Admitting: Internal Medicine

## 2023-01-05 NOTE — Telephone Encounter (Signed)
Pt has a few questions regarding Leqvio. Please advise.

## 2023-01-05 NOTE — Telephone Encounter (Signed)
Patient states taking Leqvio. He states received call from [place] this past week regarding questions.  He states now [employer] has called him regarding questions via insurance. He states he needs a call because it is an insurance issue

## 2023-01-09 NOTE — Telephone Encounter (Signed)
Injection scheduled 04/11/23

## 2023-01-10 NOTE — Telephone Encounter (Signed)
Spoke with patient. Provided him an update on leqvio -- 09/2022 injection will be appealed, infusion center is working on patient assistance. Scheduled him for 12/10 with Hilty MD for lipid f/u -- lab slips will be mailed

## 2023-03-21 ENCOUNTER — Telehealth: Payer: Self-pay | Admitting: Internal Medicine

## 2023-03-21 DIAGNOSIS — E785 Hyperlipidemia, unspecified: Secondary | ICD-10-CM

## 2023-03-21 NOTE — Telephone Encounter (Signed)
Patient states he was to go to get an infusion this week and was cancelled.  He states heeds to know when he can be set up.  He states [employer] cleared him until the end of year. He ask how to get this taken care of and get him scheduled

## 2023-03-21 NOTE — Telephone Encounter (Signed)
Pt states he needs to talk with a nurse about Novardis. Please advise

## 2023-03-22 ENCOUNTER — Encounter: Payer: Self-pay | Admitting: Internal Medicine

## 2023-03-22 NOTE — Telephone Encounter (Signed)
Spoke with patient about Leqvio. He is scheduled at [facility] on 10/30. Provided info on the office location and phone #. He said he had a letter from [employer] that he approved for assistance(?) thru the end of this year. Lab slip mailed -- to be done before his 05/22/23 visit  Will forward on to infusion center for follow up about patient assistance

## 2023-03-22 NOTE — Telephone Encounter (Signed)
Leqvio injection thru [facility] will send dose on 03/27/23 for 04/11/23

## 2023-04-11 ENCOUNTER — Encounter: Payer: Self-pay | Admitting: Internal Medicine

## 2023-04-11 ENCOUNTER — Inpatient Hospital Stay (HOSPITAL_COMMUNITY): Admission: RE | Admit: 2023-04-11 | Payer: 59 | Source: Ambulatory Visit

## 2023-04-11 ENCOUNTER — Ambulatory Visit (INDEPENDENT_AMBULATORY_CARE_PROVIDER_SITE_OTHER): Payer: Medicare Other

## 2023-04-11 VITALS — BP 152/71 | HR 71 | Temp 98.0°F | Resp 16 | Ht 68.0 in | Wt 189.0 lb

## 2023-04-11 DIAGNOSIS — E785 Hyperlipidemia, unspecified: Secondary | ICD-10-CM

## 2023-04-11 MED ORDER — INCLISIRAN SODIUM 284 MG/1.5ML ~~LOC~~ SOSY
284.0000 mg | PREFILLED_SYRINGE | Freq: Once | SUBCUTANEOUS | Status: AC
  Administered 2023-04-11: 284 mg via SUBCUTANEOUS
  Filled 2023-04-11: qty 1.5

## 2023-04-11 NOTE — Progress Notes (Signed)
Diagnosis: Hyperlipidemia  Provider:  Chilton Greathouse MD  Procedure: Injection  Leqvio (inclisiran), Dose: 284 mg, Site: subcutaneous, Number of injections: 1  Post Care: Patient declined observation  Discharge: Condition: Good, Destination: Home . AVS Provided  Performed by:  Rico Ala, LPN

## 2023-05-04 ENCOUNTER — Encounter: Payer: Self-pay | Admitting: Internal Medicine

## 2023-05-16 LAB — LIPID PANEL
Chol/HDL Ratio: 3.4 {ratio} (ref 0.0–5.0)
Cholesterol, Total: 143 mg/dL (ref 100–199)
HDL: 42 mg/dL (ref >39)
LDL Chol Calc (NIH): 55 mg/dL (ref 0–99)
Triglycerides: 300 mg/dL — ABNORMAL HIGH (ref 0–149)
VLDL Cholesterol Cal: 46 mg/dL — ABNORMAL HIGH (ref 5–40)

## 2023-05-18 ENCOUNTER — Encounter: Payer: Self-pay | Admitting: *Deleted

## 2023-05-22 ENCOUNTER — Encounter (HOSPITAL_BASED_OUTPATIENT_CLINIC_OR_DEPARTMENT_OTHER): Payer: Self-pay | Admitting: Internal Medicine

## 2023-05-22 ENCOUNTER — Ambulatory Visit (HOSPITAL_BASED_OUTPATIENT_CLINIC_OR_DEPARTMENT_OTHER): Payer: Medicare Other | Admitting: Internal Medicine

## 2023-05-22 VITALS — BP 122/74 | HR 86 | Ht 68.0 in | Wt 193.7 lb

## 2023-05-22 DIAGNOSIS — I2510 Atherosclerotic heart disease of native coronary artery without angina pectoris: Secondary | ICD-10-CM | POA: Diagnosis not present

## 2023-05-22 DIAGNOSIS — I2583 Coronary atherosclerosis due to lipid rich plaque: Secondary | ICD-10-CM

## 2023-05-22 DIAGNOSIS — E785 Hyperlipidemia, unspecified: Secondary | ICD-10-CM

## 2023-05-22 DIAGNOSIS — T466X5D Adverse effect of antihyperlipidemic and antiarteriosclerotic drugs, subsequent encounter: Secondary | ICD-10-CM

## 2023-05-22 DIAGNOSIS — T466X5A Adverse effect of antihyperlipidemic and antiarteriosclerotic drugs, initial encounter: Secondary | ICD-10-CM

## 2023-05-22 DIAGNOSIS — M791 Myalgia, unspecified site: Secondary | ICD-10-CM

## 2023-05-22 DIAGNOSIS — I10 Essential (primary) hypertension: Secondary | ICD-10-CM | POA: Diagnosis not present

## 2023-05-22 NOTE — Progress Notes (Signed)
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 He has had problems with sciatic pain and recently had back surgery with marked improvement. He has also had dyslipidemia but has been intolerant to statins and is only fenofibrate at this time. After that episode in 2011 with hypertension and hypokalemia he was on a number of blood pressure medicines. However, recently he has been feeling fatigued and dizzy. He checked his blood pressure and it was in the 80s systolic and he has since then cut back on his medicines. He has discontinued labetalol altogether and previously was on 200 b.i.d. He recently stopped his hydralazine due to low blood pressure and is only on ramipril 10 mg and aspirin 81 mg daily. He also takes Aldactone 50 mg daily. He has no complaints as far as chest pain, worsening shortness of breath, palpitations, presyncope, or syncopal symptoms.   Charles Martinez returns today for follow-up. He denies any chest pain or worsening shortness of breath. He has had a couple episodes where he felt that he was not in often quickly woke up. This does not happen while driving but he did have an episode about 2 years ago. We have been decreasing his blood pressure medications although seems to be stable now only on ramipril and Aldactone.  I saw Charles Martinez back today in the office. Overall he seems to be doing fairly well. He continues to have problems with dizziness which are related to inner ear issues. Blood pressure appears to be well-controlled today 128/86. In fact it is been fairly stable. Previously had been on a lot of blood pressure medicine and then he started to cut the medicine back after what sounded like a syncopal episode. This happened while driving, but  he's had no further episodes since this last episode. He denies any chest pain or worsening shortness of breath. Recently had cholesterol profile obtained which shows a elevated cholesterol and LDL greater than 130. He reports he is not interested in taking cholesterol medication because he really does not feel that cholesterol medications are beneficial.  06/21/2016  Charles Martinez was seen today in follow-up. He seems to be doing fairly well. He denies any chest pain or worsening shortness of breath. His most recent cholesterol profile from 2016 indicated that LDL-C was 168. He has not been on cholesterol medication due to intolerance in the past. His previously been on Lipitor and Crestor and developed myalgias. 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 I discussed options with him today including the possibility of enrolling in the Orion-10 trial to evaluate a novel siRNA that inhibits PC SK 9 receptor expression and can significantly lower cholesterol. He did seem interested in this study.  06/22/2017  Charles Martinez was seen today for yearly follow-up.  Over the past year he is done well denies any chest pain or worsening shortness of breath.  He has been involved in the [employer] study.  This is scheduled to conclude with his follow-up visit in April 2019.  Unfortunately he is blinded and we are not sure whether or not he is receiving therapy or not, but seems to be  doing well.  Blood pressure is adequately controlled today.  EKG shows no ischemic changes.  01/31/2018  I saw Charles Martinez back today in follow-up.  Overall he is doing well.  He denies any chest pain or shortness of breath.  Fortunately he is continued in the Orion-10 study and now has been rolled over to [place]- 8.  I have been blinded to his lipid profiles.  However, and the new  arm of the study he will be getting drug no matter what.  He has had no new symptoms or new cardiovascular events.  Blood pressure is well controlled.  02/24/2019  Charles Martinez returns today for follow-up.  Overall he continues to do well denies any chest pain or worsening shortness of breath.  He has rolled over into the treatment arm of Orion although his lipids have still been blinded.  I will reach out to the study coordinator to see if at this point I could be privy to the data.  We have not repeated a lipid profile done recently.  Blood pressure appears to be well controlled today.  Weight is reasonable.  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  03/24/2020  Charles Martinez seen today for annual follow-up.  Overall he seems to be feeling well.  He is switched over to the active treatment arm of the Orion 8 study and is on inclisiran.  At this point it is okay for [place] to assess his lipids and I would like to see his response to therapy.  Blood pressure is excellent today 126/64.  Weight is reasonable.  He is denied any chest pain or worsening shortness of breath.  EKG shows a sinus rhythm.  04/12/2021  Charles Martinez is seen today in follow-up.  He is come out of the Orion-8 trial and fortunately was on therapy.  He has been offered brand-name Leqvio and qualified by insurance.  He had his first injection about a week ago.  Since he had a ready been on therapy, we would continue him on a 10-month cycle and his next shot would be in 6 months.  He is noted to have elevated triglycerides.  I had previously recommended fenofibrate however he did not get the prescription filled and has not started the medicine.  08/03/2021  Charles Martinez returns today for follow-up.  Overall he is doing well.  He has had 1 dose of Leqvio after transitioning out of the Orion-8 study.  He is due for another injection in April.  He has not had repeat lipids.  I like to get those however he said he made some dietary changes  and his wife has been diagnosed with alpha gal and they will need to make more dietary changes therefore he would like to wait.  05/22/2023  Mr. Burroughs is seen today in follow-up.  He continues to do well on Leqvio.  His lipids have been fairly stable.  Recent labs showed total cholesterol 143, HDL 42, triglycerides 300 and LDL 55.  His triglycerides remain elevated.  We have previously tried him on therapy for this but he did not tolerate it well.  Although this is a secondary risk factor for cardiovascular disease, only the Vasce[facility] has shown cardiovascular risk reduction associated with this.  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  PMHx:  Past Medical History:  Diagnosis Date   Allergy    BPH (benign prostatic hypertrophy)  Cataract    right removed, left forming   Coronary artery disease due to lipid rich plaque    cardiologist-  dr Rennis Golden   Dyslipidemia    intolerant to statins   Family history of adverse reaction to anesthesia    uncle never regained responsiveness   Hearing loss in left ear    Hyperlipidemia    Hypertension    Meniere's disease    Morton's neuroma    OSA (obstructive sleep apnea)    no cpap  s/p  UVPP in 2000   Peripheral neuropathy    feet   Phimosis    Skin disorder    Tinnitus     Past Surgical History:  Procedure Laterality Date   CARDIAC CATHETERIZATION  early 2000's (per dr Cathleen Yagi note)   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   CARDIOVASCULAR STRESS TEST  11-08-2007  dr Maury Groninger   normal nuclear study/  no ischemia or infarct/ normal LV function and wall motion , ef 69%   CIRCUMCISION N/A 03/22/2015   Procedure: CIRCUMCISION ADULT;  Surgeon: Marcine Matar, MD;  Location: [facility];  Service: Urology;  Laterality: N/A;   COLONOSCOPY     ~20 yrs per pt - he has had cologuard in the past that was negative    FOOT SURGERY  06/13/1999   LASIK     LUMBAR MICRODISCECTOMY  09/14/2010   L5 -- S1   MOHS SURGERY     forehead, eye lid x 2   TRANSTHORACIC ECHOCARDIOGRAM  03/25/2010   normal LV, ef 60-655/  mild MR and TR   UVULOPALATOPHARYNGOPLASTY     tonsils removed with turbinate reduction    FAMHx:  Family History  Problem Relation Age of Onset   Intracerebral hemorrhage Father    Colon cancer Neg Hx    Colon polyps Neg Hx    Esophageal cancer Neg Hx    Rectal cancer Neg Hx    Stomach cancer Neg Hx     SOCHx:   reports that he quit smoking about 28 years ago. His smoking use included cigarettes. He has never used smokeless tobacco. He reports current alcohol use. He reports that he does not use drugs.  ALLERGIES:  Allergies  Allergen Reactions   Bystolic [Nebivolol Hcl] Shortness Of Breath   Hydrazine Yellow [Fd&C Yellow #5 (Tartrazine)] Shortness Of Breath        Crestor [Rosuvastatin Calcium] Other (See Comments)    Myalgias    Doxycycline Other (See Comments)    Other   Hctz [Hydrochlorothiazide]    Lipitor [Atorvastatin] Other (See Comments)    myalgias    ROS: Pertinent items noted in HPI and remainder of comprehensive ROS otherwise negative.  HOME MEDS: Current Outpatient Medications  Medication Sig Dispense Refill   ACETYLCARN-ALPHA LIPOIC ACID PO Take by mouth daily.     Ascorbic Acid (VITAMIN C PO) Take by mouth daily.     B Complex Vitamins (B COMPLEX 100 PO) Take 1 tablet by mouth daily.     Biotin 5000 MCG TABS Take 1 tablet by mouth daily.     Black Pepper-Turmeric (TURMERIC COMPLEX/BLACK PEPPER PO) Take by mouth.     Cholecalciferol (VITAMIN D-3) 1000 UNITS CAPS Take 2,000 Units by mouth daily.     clonazePAM (KLONOPIN) 0.5 MG tablet Take 0.5 mg by mouth 2 (two) times daily as needed for anxiety.     Cod Liver Oil 1000 MG CAPS Take 1 capsule by mouth daily.  Coenzyme Q10 100 MG capsule Take 100 mg by mouth daily.     finasteride (PROSCAR) 5 MG tablet  Take 5 mg by mouth daily.     magnesium oxide (MAG-OX) 400 MG tablet Take 400 mg by mouth daily.     meclizine (ANTIVERT) 25 MG tablet Take 25 mg by mouth 3 (three) times daily as needed for dizziness.     Probiotic Product (PROBIOTIC PO) Take by mouth.     QUERCETIN PO Take by mouth.     ramipril (ALTACE) 10 MG capsule Take 1 capsule (10 mg total) by mouth daily. (Patient taking differently: Take 5 mg by mouth daily.) 90 capsule 3   spironolactone (ALDACTONE) 50 MG tablet Take 50 mg by mouth daily.     Ubiquinol 100 MG CAPS Take by mouth.     ZINC SULFATE PO Take 1 tablet by mouth daily.     Lutein-Zeaxanthin 20-0.8 MG CAPS Take 1 capsule by mouth every morning. (Patient not taking: Reported on 05/22/2023)     Current Facility-Administered Medications  Medication Dose Route Frequency Provider Last Rate Last Admin   inclisiran (LEQVIO) injection 284 mg  284 mg Subcutaneous Once Zeus Marquis, Lisette Abu, MD        LABS/IMAGING: No results found for this or any previous visit (from the past 48 hour(s)). No results found.  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   Mr. Isa continues to have excellent control of his LDL cholesterol but has high triglycerides.  He was hesitant to take fenofibrate and is on over-the-counter fish oil.  Will not make adjustments to his therapy at this time.  Continue Leqvio as scheduled.  Chrystie Nose, MD, [facility], FACP  [place]  [facility]  Medical Director of the Advanced Lipid Disorders &   Cardiovascular Risk Reduction Clinic Diplomate of the American Board of Clinical Lipidology Attending Cardiologist  Direct Dial: [phone]  Fax: [phone]  Website:  [email or web address]  Chrystie Nose 05/22/2023, 3:52 PM'

## 2023-05-22 NOTE — Patient Instructions (Signed)
Medication Instructions:  NO CHANGES  *If you need a refill on your cardiac medications before your next appointment, please call your pharmacy*   Lab Work: FASTING lab work in 1 year    Follow-Up: At [facility], you and your health needs are our priority.  As part of our continuing mission to provide you with exceptional heart care, we have created designated Provider Care Teams.  These Care Teams include your primary Cardiologist (physician) and Advanced Practice Providers (APPs -  Physician Assistants and Nurse Practitioners) who all work together to provide you with the care you need, when you need it.  We recommend signing up for the patient portal called "MyChart".  Sign up information is provided on this After Visit Summary.  MyChart is used to connect with patients for Virtual Visits (Telemedicine).  Patients are able to view lab/test results, encounter notes, upcoming appointments, etc.  Non-urgent messages can be sent to your provider as well.   To learn more about what you can do with MyChart, go to [email or web address].    Your next appointment:    12 months with Dr. Rennis Golden

## 2023-10-11 ENCOUNTER — Ambulatory Visit (INDEPENDENT_AMBULATORY_CARE_PROVIDER_SITE_OTHER): Payer: 59

## 2023-10-11 VITALS — BP 147/79 | HR 76 | Temp 98.1°F | Resp 16 | Ht 68.0 in | Wt 188.4 lb

## 2023-10-11 DIAGNOSIS — E785 Hyperlipidemia, unspecified: Secondary | ICD-10-CM | POA: Diagnosis not present

## 2023-10-11 MED ORDER — INCLISIRAN SODIUM 284 MG/1.5ML ~~LOC~~ SOSY
284.0000 mg | PREFILLED_SYRINGE | Freq: Once | SUBCUTANEOUS | Status: AC
  Administered 2023-10-11: 284 mg via SUBCUTANEOUS
  Filled 2023-10-11: qty 1.5

## 2023-10-11 NOTE — Progress Notes (Signed)
 Diagnosis: Hyperlipidemia  Provider:  Chilton Greathouse MD  Procedure: Injection  Leqvio (inclisiran), Dose: 284 mg, Site: subcutaneous, Number of injections: 1  Injection Site(s): Right lower quad. abdomne  Post Care:  right lower quad abdominal injection  Discharge: Condition: Good, Destination: Home . AVS Provided  Performed by:  Rico Ala, LPN

## 2024-03-24 ENCOUNTER — Telehealth: Payer: Self-pay | Admitting: Pharmacy Technician

## 2024-03-24 NOTE — Telephone Encounter (Addendum)
 Auth Submission: PAP APPROVED Site of care: Site of care: [facility] Payer: [employer] MEDICARE Medication & CPT/J Code(s) submitted: Leqvio  (Inclisiran) J1306 Diagnosis Code: E78.5 Route of submission (phone, fax, portal):  Phone # Fax # Auth type: PAP - FREE DRUG Units/visits requested: X2 DOSES Q6MONTHS Reference number: [id number] Approval from:

## 2024-04-04 ENCOUNTER — Other Ambulatory Visit: Payer: Self-pay | Admitting: *Deleted

## 2024-04-04 DIAGNOSIS — E785 Hyperlipidemia, unspecified: Secondary | ICD-10-CM

## 2024-04-08 ENCOUNTER — Encounter: Payer: Self-pay | Admitting: Internal Medicine

## 2024-04-14 ENCOUNTER — Ambulatory Visit (INDEPENDENT_AMBULATORY_CARE_PROVIDER_SITE_OTHER)

## 2024-04-14 VITALS — BP 116/71 | HR 77 | Temp 97.9°F | Resp 12 | Ht 68.0 in | Wt 184.4 lb

## 2024-04-14 DIAGNOSIS — E785 Hyperlipidemia, unspecified: Secondary | ICD-10-CM | POA: Diagnosis not present

## 2024-04-14 MED ORDER — INCLISIRAN SODIUM 284 MG/1.5ML ~~LOC~~ SOSY
284.0000 mg | PREFILLED_SYRINGE | Freq: Once | SUBCUTANEOUS | Status: AC
  Administered 2024-04-14: 284 mg via SUBCUTANEOUS
  Filled 2024-04-14: qty 1.5

## 2024-04-14 NOTE — Progress Notes (Signed)
 Diagnosis: Hyperlipidemia  Provider:  Mannam, Praveen MD  Procedure: Injection  Leqvio  (inclisiran), Dose: 284 mg, Site: subcutaneous, Number of injections: 1  Injection Site(s): Right lower quad. abdomne  Post Care: Patient declined observation  Discharge: Condition: Good, Destination: Home . AVS Provided  Performed by:  Rocky FORBES Sar, RN

## 2024-10-13 ENCOUNTER — Ambulatory Visit
